# Patient Record
Sex: Male | Born: 1975 | Race: Black or African American | Hispanic: No | Marital: Married | Smoking: Never smoker
Health system: Southern US, Community
[De-identification: ages and names within clinical notes are randomized; demographics above are authoritative.]

## PROBLEM LIST (undated history)

## (undated) DIAGNOSIS — I10 Essential (primary) hypertension: Secondary | ICD-10-CM

## (undated) DIAGNOSIS — M109 Gout, unspecified: Secondary | ICD-10-CM

---

## 2021-06-01 ENCOUNTER — Encounter (HOSPITAL_COMMUNITY): Payer: Self-pay

## 2021-06-01 ENCOUNTER — Emergency Department (HOSPITAL_COMMUNITY): Payer: Medicaid Other

## 2021-06-01 ENCOUNTER — Emergency Department (HOSPITAL_COMMUNITY)
Admission: EM | Admit: 2021-06-01 | Discharge: 2021-06-01 | Disposition: A | Discharge status: Home / Self Care | Payer: Medicaid Other | Attending: Emergency Medicine | Admitting: Emergency Medicine

## 2021-06-01 ENCOUNTER — Other Ambulatory Visit: Payer: Self-pay

## 2021-06-01 DIAGNOSIS — W01198A Fall on same level from slipping, tripping and stumbling with subsequent striking against other object, initial encounter: Secondary | ICD-10-CM | POA: Diagnosis not present

## 2021-06-01 DIAGNOSIS — S01511A Laceration without foreign body of lip, initial encounter: Secondary | ICD-10-CM | POA: Insufficient documentation

## 2021-06-01 DIAGNOSIS — S069X9A Unspecified intracranial injury with loss of consciousness of unspecified duration, initial encounter: Secondary | ICD-10-CM

## 2021-06-01 DIAGNOSIS — Y990 Civilian activity done for income or pay: Secondary | ICD-10-CM | POA: Diagnosis not present

## 2021-06-01 DIAGNOSIS — S0990XA Unspecified injury of head, initial encounter: Secondary | ICD-10-CM | POA: Insufficient documentation

## 2021-06-01 DIAGNOSIS — I10 Essential (primary) hypertension: Secondary | ICD-10-CM | POA: Diagnosis not present

## 2021-06-01 DIAGNOSIS — S0993XA Unspecified injury of face, initial encounter: Secondary | ICD-10-CM | POA: Diagnosis present

## 2021-06-01 HISTORY — DX: Essential (primary) hypertension: I10

## 2021-06-01 HISTORY — DX: Gout, unspecified: M10.9

## 2021-06-01 MED ORDER — LIDOCAINE-EPINEPHRINE-TETRACAINE (LET) TOPICAL GEL
3.0000 mL | Freq: Once | TOPICAL | Status: DC
  Filled 2021-06-01: qty 3

## 2021-06-01 MED ORDER — ACETAMINOPHEN 500 MG PO TABS
1000.0000 mg | ORAL_TABLET | Freq: Once | ORAL | Status: AC
  Administered 2021-06-01: 1000 mg via ORAL
  Filled 2021-06-01: qty 2

## 2021-06-01 NOTE — ED Notes (Signed)
Patient transported to CT 

## 2021-06-01 NOTE — ED Provider Notes (Signed)
[facility] EMERGENCY DEPARTMENT Provider Note   CSN: [id number] Arrival date & time: 06/01/21  1519     History Chief Complaint  Patient presents with   Facial Injury    Derrick Harrell is a 45 y.o. male.  45 year old male who with history of hypertension and gout who presents with head injury and loss of consciousness.  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  He hit the back of his head and lost consciousness for a brief period.  He woke up on the ground coughing and choking on blood from a lip laceration.  He endorses pain in the back of his head as well as his lip but he denies any other areas of pain including no extremity injury, neck, or back pain.  No vision changes or vomiting since the injury.  No anticoagulant use.  He denies any history of syncopal episodes.  The history is provided by the patient.  Facial Injury     Past Medical History:  Diagnosis Date   Gout    Hypertension     There are no problems to display for this patient.     No family history on file.  Social History   Tobacco Use   Smoking status: Never   Smokeless tobacco: Never  Substance Use Topics   Alcohol use: Not Currently    Comment: 3 weeks sober   Drug use: Not Currently    Home Medications Prior to Admission medications   Not on File    Allergies    Patient has no known allergies.  Review of Systems   Review of Systems All other systems reviewed and are negative except that which was mentioned in HPI  Physical Exam Updated Vital Signs BP (!) 165/109   Pulse 94   Temp 98.9 F (37.2 C) (Oral)   Resp 17   Ht 5\' 8"  (1.727 m)   Wt 112.5 kg   SpO2 97%   BMI 37.71 kg/m   Physical Exam Constitutional:      General: He is not in acute distress.    Appearance: Normal appearance.   HENT:     Head: Normocephalic and atraumatic.     Comments: Tenderness posterior scalp without hematoma    Nose: Nose normal.     Mouth/Throat:     Mouth: Mucous membranes are moist.     Comments: L upper lip laceration w/ associated swelling, superficial and tracking under lip, does not cross vermilion border; no tooth injury Eyes:     Extraocular Movements: Extraocular movements intact.     Conjunctiva/sclera: Conjunctivae normal.     Pupils: Pupils are equal, round, and reactive to light.  Neck:     Comments: In c-collar, no focal tenderness Cardiovascular:     Rate and Rhythm: Normal rate and regular rhythm.     Heart sounds: Normal heart sounds. No murmur heard. Pulmonary:     Effort: Pulmonary effort is normal.     Breath sounds: Normal breath sounds.  Abdominal:     General: Abdomen is flat. Bowel sounds are normal. There is no distension.     Palpations: Abdomen is soft.     Tenderness: There is no abdominal tenderness.  Musculoskeletal:     Right lower leg: No edema.     Left lower leg: No  edema.  Skin:    General: Skin is warm and dry.  Neurological:     Mental Status: He is alert and oriented to person, place, and time.     Comments: Fluent speech, CN II-XII intact, 5/5 strength throughout and normal coordination  Psychiatric:        Mood and Affect: Mood normal.        Behavior: Behavior normal.    ED Results / Procedures / Treatments   Labs (all labs ordered are listed, but only abnormal results are displayed) Labs Reviewed - No data to display  EKG EKG Interpretation  Date/Time:  Thursday June 01 2021 15:27:11 EDT Ventricular Rate:  95 PR Interval:  180 QRS Duration: 103 QT Interval:  358 QTC Calculation: 450 R Axis:   -4 Text Interpretation: Sinus rhythm LAE, consider biatrial enlargement Consider anterior infarct No previous ECGs available Confirmed by Frederick Peers [phone]) on 06/01/2021 4:26:18 PM  Radiology CT Head Wo Contrast  Result Date:  06/01/2021 CLINICAL DATA:  Larey Seat off [place]let [place]. EXAM: CT HEAD WITHOUT CONTRAST CT CERVICAL SPINE WITHOUT CONTRAST TECHNIQUE: Multidetector CT imaging of the head and cervical spine was performed following the standard protocol without intravenous contrast. Multiplanar CT image reconstructions of the cervical spine were also generated. COMPARISON:  None. FINDINGS: CT HEAD FINDINGS Brain: No evidence of large-territorial acute infarction. No parenchymal hemorrhage. No mass lesion. No extra-axial collection. No mass effect or midline shift. No hydrocephalus. Basilar cisterns are patent. Vascular: No hyperdense vessel. Skull: No acute fracture or focal lesion. Sinuses/Orbits: Paranasal sinuses and mastoid air cells are clear. The orbits are unremarkable. Other: None. CT CERVICAL SPINE FINDINGS Alignment: Reversal of the normal cervical lordosis likely due to positioning and degenerative changes. Skull base and vertebrae: Linear density along the right C1 transverse process nonspecific with no definite fracture. No acute fracture. Multilevel mild degenerative changes of the spine. No aggressive appearing focal osseous lesion or focal pathologic process. Soft tissues and spinal canal: No prevertebral fluid or swelling. No visible canal hematoma. Upper chest: Right apex trace pleural/pulmonary scarring. No definite pneumothorax. Other: None. IMPRESSION: 1. No acute intracranial abnormality. 2. No acute displaced fracture or traumatic listhesis of the cervical spine. Electronically Signed   By: Tish Frederickson M.D.   On: 06/01/2021 16:53   CT Cervical Spine Wo Contrast  Result Date: 06/01/2021 CLINICAL DATA:  Larey Seat off [place]. EXAM: CT HEAD WITHOUT CONTRAST CT CERVICAL SPINE WITHOUT CONTRAST TECHNIQUE: Multidetector CT imaging of the head and cervical spine was performed following the standard protocol without intravenous contrast. Multiplanar CT image reconstructions of the cervical spine were also generated.  COMPARISON:  None. FINDINGS: CT HEAD FINDINGS Brain: No evidence of large-territorial acute infarction. No parenchymal hemorrhage. No mass lesion. No extra-axial collection. No mass effect or midline shift. No hydrocephalus. Basilar cisterns are patent. Vascular: No hyperdense vessel. Skull: No acute fracture or focal lesion. Sinuses/Orbits: Paranasal sinuses and mastoid air cells are clear. The orbits are unremarkable. Other: None. CT CERVICAL SPINE FINDINGS Alignment: Reversal of the normal cervical lordosis likely due to positioning and degenerative changes. Skull base and vertebrae: Linear density along the right C1 transverse process nonspecific with no definite fracture. No acute fracture. Multilevel mild degenerative changes of the spine. No aggressive appearing focal osseous lesion or focal pathologic process. Soft tissues and spinal canal: No prevertebral fluid or swelling. No visible canal hematoma. Upper chest: Right apex trace pleural/pulmonary scarring. No definite pneumothorax. Other: None. IMPRESSION: 1. No acute intracranial abnormality. 2.  No acute displaced fracture or traumatic listhesis of the cervical spine. Electronically Signed   By: Tish Frederickson M.D.   On: 06/01/2021 16:53    Procedures Procedures   Medications Ordered in ED Medications  acetaminophen (TYLENOL) tablet 1,000 mg (1,000 mg Oral Given 06/01/21 1647)    ED Course  I have reviewed the triage vital signs and the nursing notes.  Pertinent labs & imaging results that were available during my care of the patient were reviewed by me and considered in my medical decision making (see chart for details).    MDM Rules/Calculators/A&P                          Alert and oriented on exam, neurologically intact.  Lip laceration is superficial and does not involve vermilion border therefore does not need sutures, recommended healing secondarily.  Discussed supportive measures for mouth injury.  EKG shows sinus rhythm.  CT  head and cervical spine show no acute injury.  Suspect loss of consciousness was due to blunt head trauma with possible concussion rather than due to syncope.  I have counseled on what to expect regarding concussion symptoms.  I have extensively reviewed return precautions with the patient and he voiced understanding. Final Clinical Impression(s) / ED Diagnoses Final diagnoses:  Head injury with loss of consciousness (HCC)  Lip laceration, initial encounter    Rx / DC Orders ED Discharge Orders     None        Maleeah Crossman, Ambrose Finland, MD 06/01/21 1819

## 2021-06-01 NOTE — ED Triage Notes (Signed)
Pt BIB [facility] for a work related injury. 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 Pt remembers feeling his hit the concrete and woke up to choking on blood. Pt reports a brief LOC. Denies N/V, endorses a headache.  BP 156/100 HR 100 RR 18 CBG 114

## 2021-08-04 ENCOUNTER — Ambulatory Visit: Payer: Medicaid Other | Admitting: Cardiovascular Disease

## 2021-08-25 ENCOUNTER — Other Ambulatory Visit: Payer: Self-pay

## 2021-08-25 ENCOUNTER — Other Ambulatory Visit: Payer: Self-pay | Admitting: Urgent Care

## 2021-08-25 ENCOUNTER — Ambulatory Visit
Admission: RE | Admit: 2021-08-25 | Discharge: 2021-08-25 | Disposition: A | Discharge status: Home / Self Care | Payer: Medicaid Other | Source: Ambulatory Visit | Attending: Urgent Care | Admitting: Urgent Care

## 2021-08-25 DIAGNOSIS — M546 Pain in thoracic spine: Secondary | ICD-10-CM

## 2021-08-25 DIAGNOSIS — M545 Low back pain, unspecified: Secondary | ICD-10-CM

## 2021-09-06 ENCOUNTER — Telehealth: Payer: Self-pay

## 2021-09-06 NOTE — Telephone Encounter (Signed)
NOTES SCANNED TO REFERRAL RJ 

## 2022-01-11 IMAGING — CT CT HEAD W/O CM
3 series · 14 of 47 positions shown, 16 images · non-contrast
Comparison: None.

CLINICAL DATA: Fell off Pallet [place].

EXAM:
CT HEAD WITHOUT CONTRAST
CT CERVICAL SPINE WITHOUT CONTRAST
TECHNIQUE: Multidetector CT imaging of the head and cervical spine was
performed following the standard protocol without intravenous
contrast. Multiplanar CT image reconstructions of the cervical spine
were also generated.

[Series 1: head 5.0 h30s · axial · 0.46mm/px · z∈[-65,+65]mm · 8 of 32 slices shown, 10 images]
[im 3/32  brain]
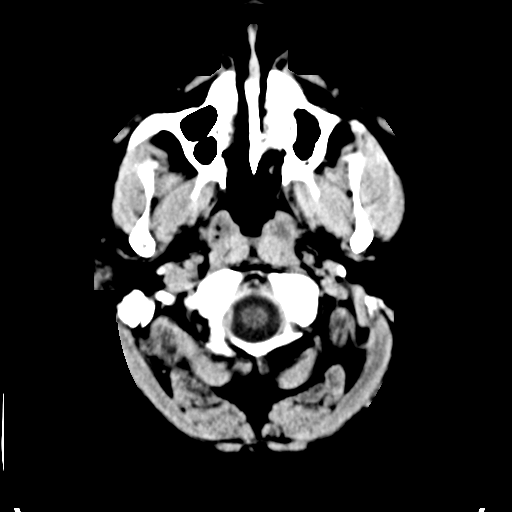
[im 3/32  bone]
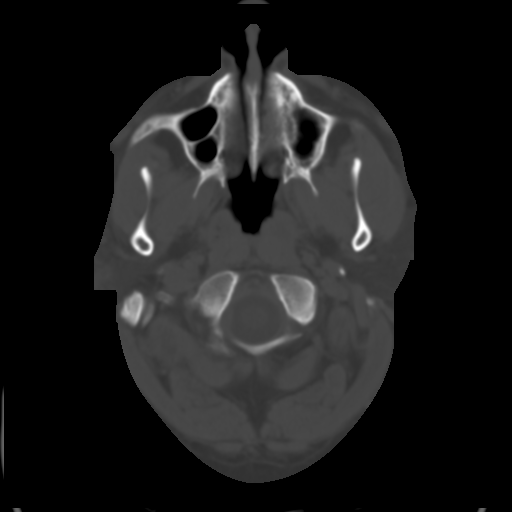
[im 7/32  brain]
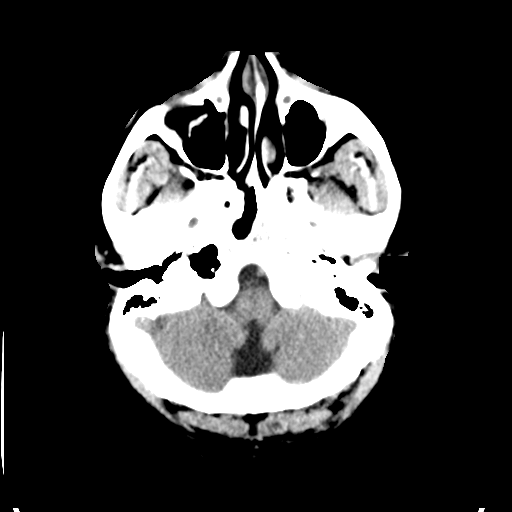
[im 10/32  brain]
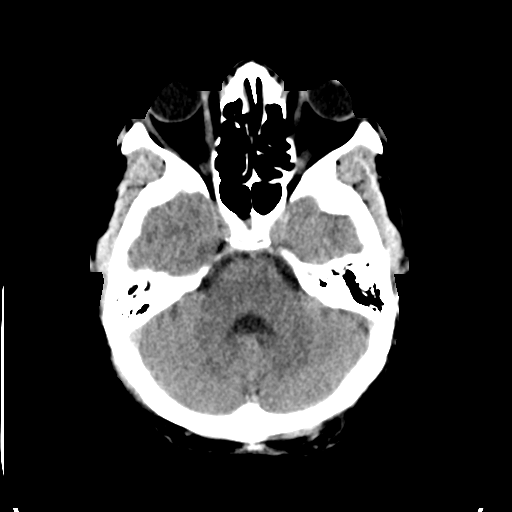
[im 14/32  brain]
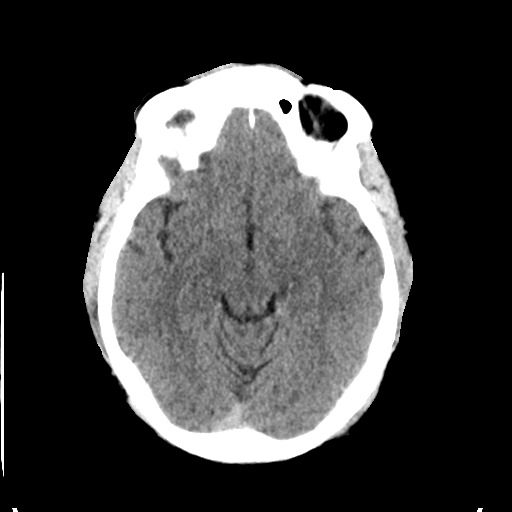
[im 18/32  brain]
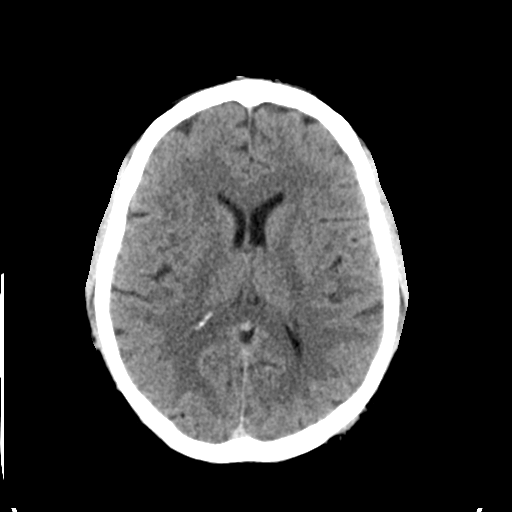
[im 18/32  bone]
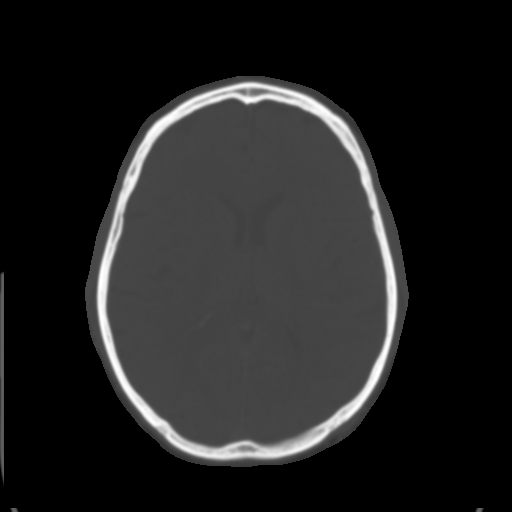
[im 22/32  brain]
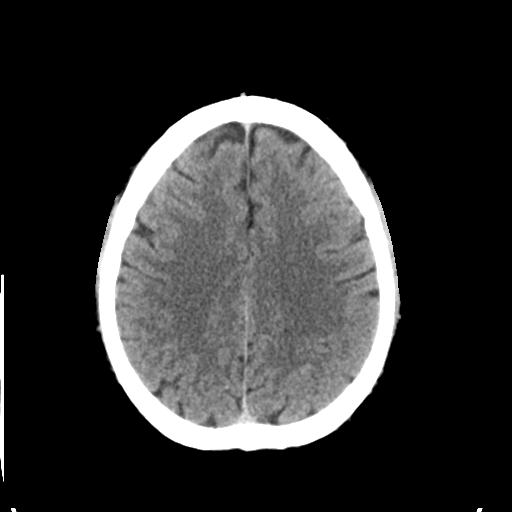
[im 25/32  brain]
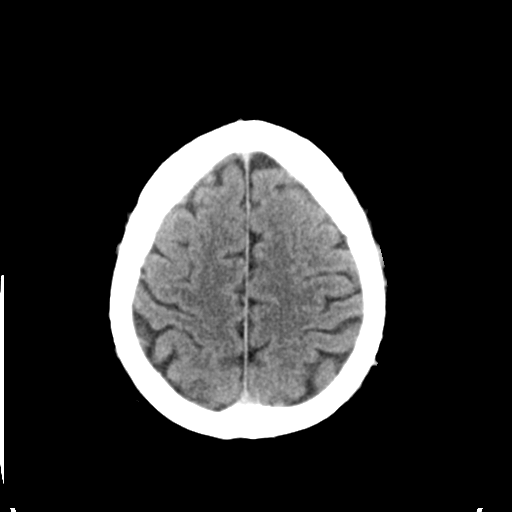
[im 29/32  brain]
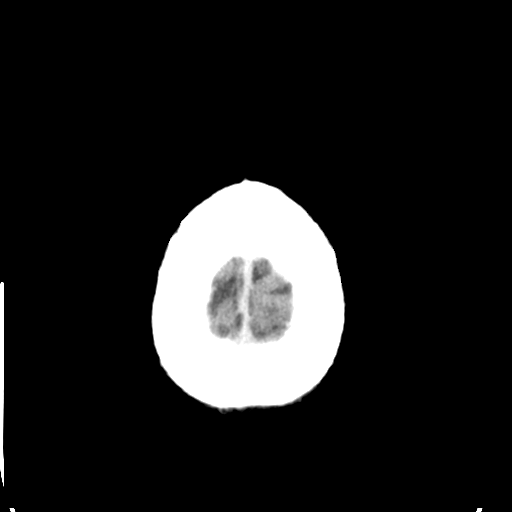

[Series 5: head 3.0 mpr cor · coronal · 0.33mm/px · 3 of 70 slices shown]
[im 24/70  brain]
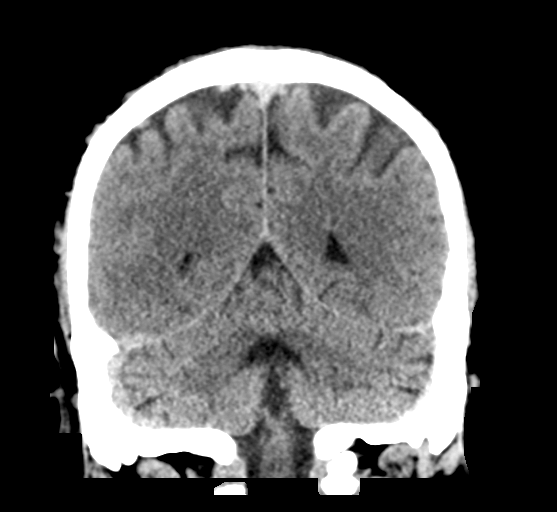
[im 31/70  brain]
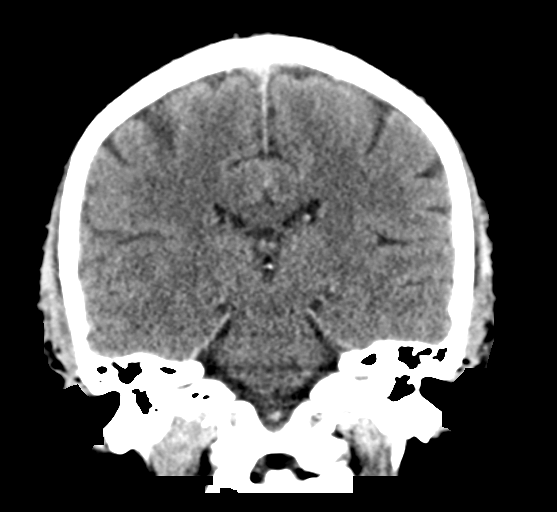
[im 39/70  brain]
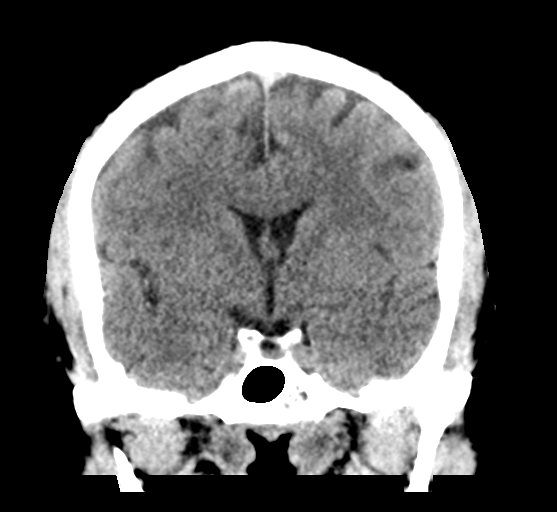

[Series 6: head 3.0 mpr sag · sagittal · 0.31mm/px · 3 of 67 slices shown]
[im 23/67  brain]
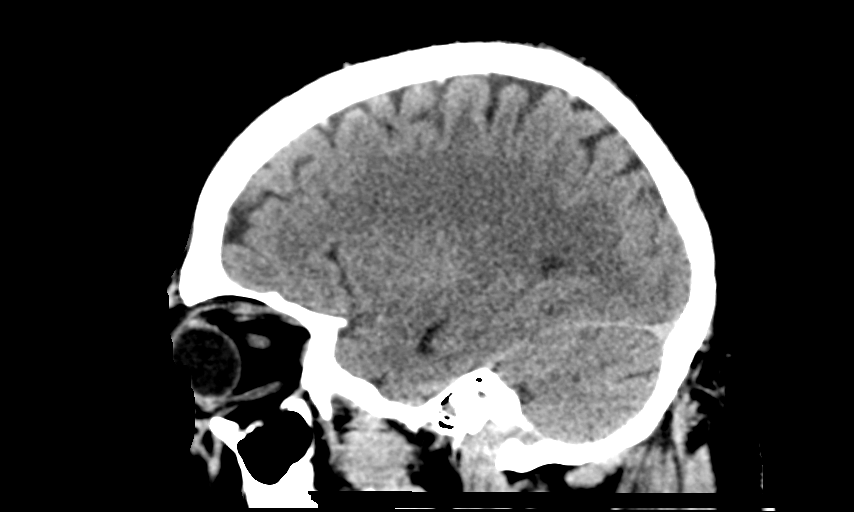
[im 34/67  brain]
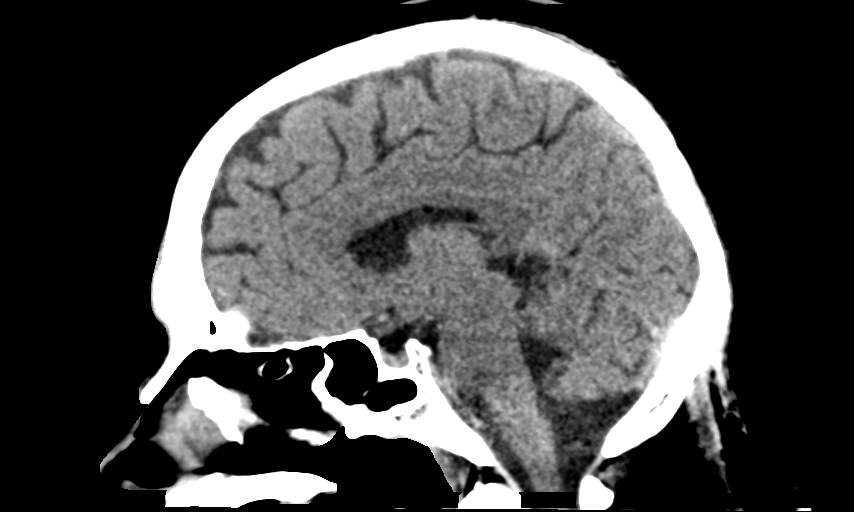
[im 45/67  brain]
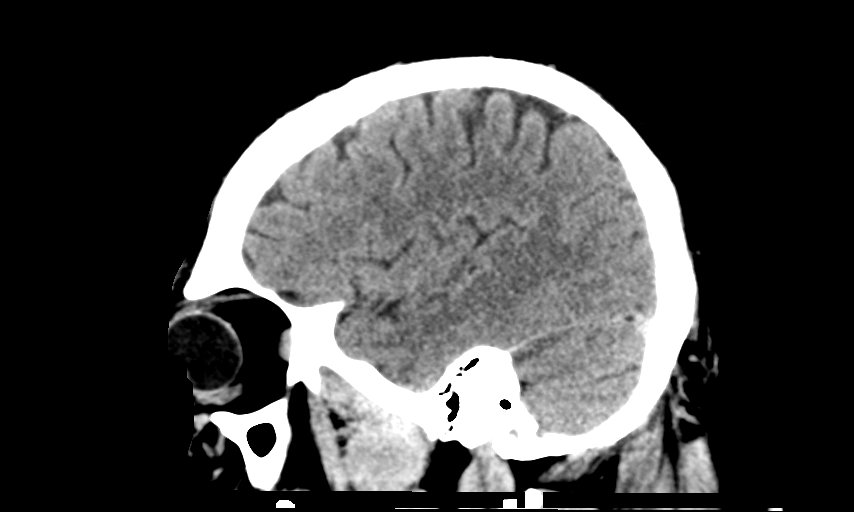

[14 of 47 positions shown; findings below may reference images not displayed]

FINDINGS: CT HEAD FINDINGS

Brain:

No evidence of large-territorial acute infarction. No parenchymal
hemorrhage. No mass lesion. No extra-axial collection.

No mass effect or midline shift. No hydrocephalus. Basilar cisterns
are patent.

Vascular: No hyperdense vessel.

Skull: No acute fracture or focal lesion.

Sinuses/Orbits: Paranasal sinuses and mastoid air cells are clear.
The orbits are unremarkable.

Other: None.

CT CERVICAL SPINE FINDINGS

Alignment: Reversal of the normal cervical lordosis likely due to
positioning and degenerative changes.

Skull base and vertebrae: Linear density along the right C1
transverse process nonspecific with no definite fracture. No acute
fracture. Multilevel mild degenerative changes of the spine. No
aggressive appearing focal osseous lesion or focal pathologic
process.

Soft tissues and spinal canal: No prevertebral fluid or swelling. No
visible canal hematoma.

Upper chest: Right apex trace pleural/pulmonary scarring. No
definite pneumothorax.

Other: None.
IMPRESSION: 1. No acute intracranial abnormality.
2. No acute displaced fracture or traumatic listhesis of the
cervical spine.

## 2023-08-21 ENCOUNTER — Emergency Department (HOSPITAL_COMMUNITY)
Admission: EM | Admit: 2023-08-21 | Discharge: 2023-08-21 | Disposition: A | Discharge status: Home / Self Care | Payer: Medicaid Other | Attending: Emergency Medicine | Admitting: Emergency Medicine

## 2023-08-21 ENCOUNTER — Encounter (HOSPITAL_COMMUNITY): Payer: Self-pay | Admitting: Emergency Medicine

## 2023-08-21 ENCOUNTER — Emergency Department (HOSPITAL_COMMUNITY): Payer: Medicaid Other

## 2023-08-21 DIAGNOSIS — K7689 Other specified diseases of liver: Secondary | ICD-10-CM | POA: Diagnosis not present

## 2023-08-21 DIAGNOSIS — R1032 Left lower quadrant pain: Secondary | ICD-10-CM

## 2023-08-21 DIAGNOSIS — K5900 Constipation, unspecified: Secondary | ICD-10-CM | POA: Diagnosis not present

## 2023-08-21 LAB — URINALYSIS, ROUTINE W REFLEX MICROSCOPIC
Bacteria, UA: NONE SEEN
Bilirubin Urine: NEGATIVE
Glucose, UA: NEGATIVE mg/dL
Ketones, ur: 5 mg/dL — AB
Leukocytes,Ua: NEGATIVE
Nitrite: NEGATIVE
Protein, ur: NEGATIVE mg/dL
Specific Gravity, Urine: 1.038 — ABNORMAL HIGH (ref 1.005–1.030)
pH: 5 (ref 5.0–8.0)

## 2023-08-21 LAB — CBC WITH DIFFERENTIAL/PLATELET
Abs Immature Granulocytes: 0.02 10*3/uL (ref 0.00–0.07)
Basophils Absolute: 0 10*3/uL (ref 0.0–0.1)
Basophils Relative: 0 %
Eosinophils Absolute: 0 10*3/uL (ref 0.0–0.5)
Eosinophils Relative: 0 %
HCT: 50.5 % (ref 39.0–52.0)
Hemoglobin: 16 g/dL (ref 13.0–17.0)
Immature Granulocytes: 0 %
Lymphocytes Relative: 35 %
Lymphs Abs: 2.5 10*3/uL (ref 0.7–4.0)
MCH: 26.2 pg (ref 26.0–34.0)
MCHC: 31.7 g/dL (ref 30.0–36.0)
MCV: 82.7 fL (ref 80.0–100.0)
Monocytes Absolute: 0.4 10*3/uL (ref 0.1–1.0)
Monocytes Relative: 6 %
Neutro Abs: 4.1 10*3/uL (ref 1.7–7.7)
Neutrophils Relative %: 59 %
Platelets: 245 10*3/uL (ref 150–400)
RBC: 6.11 MIL/uL — ABNORMAL HIGH (ref 4.22–5.81)
RDW: 14.5 % (ref 11.5–15.5)
WBC: 7.1 10*3/uL (ref 4.0–10.5)
nRBC: 0 % (ref 0.0–0.2)

## 2023-08-21 LAB — COMPREHENSIVE METABOLIC PANEL
ALT: 23 U/L (ref 0–44)
AST: 20 U/L (ref 15–41)
Albumin: 3.8 g/dL (ref 3.5–5.0)
Alkaline Phosphatase: 63 U/L (ref 38–126)
Anion gap: 11 (ref 5–15)
BUN: 5 mg/dL — ABNORMAL LOW (ref 6–20)
CO2: 21 mmol/L — ABNORMAL LOW (ref 22–32)
Calcium: 8.9 mg/dL (ref 8.9–10.3)
Chloride: 103 mmol/L (ref 98–111)
Creatinine, Ser: 0.93 mg/dL (ref 0.61–1.24)
GFR, Estimated: >60 mL/min (ref >60)
Glucose, Bld: 102 mg/dL — ABNORMAL HIGH (ref 70–99)
Potassium: 4 mmol/L (ref 3.5–5.1)
Sodium: 135 mmol/L (ref 135–145)
Total Bilirubin: 0.6 mg/dL (ref 0.3–1.2)
Total Protein: 7.3 g/dL (ref 6.5–8.1)

## 2023-08-21 LAB — LIPASE, BLOOD: Lipase: 37 U/L (ref 11–51)

## 2023-08-21 MED ORDER — POLYETHYLENE GLYCOL 3350 17 GM/SCOOP PO POWD: 17.0000 g | Freq: Every day | ORAL | 0 refills | Status: AC

## 2023-08-21 MED ORDER — IOHEXOL 350 MG/ML SOLN
75.0000 mL | Freq: Once | INTRAVENOUS | Status: AC | PRN
  Administered 2023-08-21: 75 mL via INTRAVENOUS

## 2023-08-21 MED ORDER — DOCUSATE SODIUM 100 MG PO CAPS: 100.0000 mg | ORAL_CAPSULE | Freq: Two times a day (BID) | ORAL | 0 refills | Status: DC

## 2023-08-21 NOTE — ED Provider Notes (Signed)
[place] EMERGENCY DEPARTMENT AT [facility] Provider Note   CSN: [id number] Arrival date & time: 08/21/23  1659     History  Chief Complaint  Patient presents with   Abdominal Pain    Derrick Harrell is a 47 y.o. male.  Patient presents to the emergency department for evaluation of left lower quadrant and left-sided flank pain ongoing over the past 2 weeks.  Patient has a history of precancerous polyps on colonoscopy.  He states that eating and drinking sometimes it makes the symptoms worse and it feels as though the food gets stuck, but he denies choking or regurgitation.  He has been having small amounts of hard stool and has been taking laxatives without much improvement.  No urinary symptoms.  No fevers.  No chest pain or shortness of breath.  He does drink alcohol on a daily basis.       Home Medications Prior to Admission medications   Medication Sig Start Date End Date Taking? Authorizing Provider  amLODipine (NORVASC) 10 MG tablet Take 10 mg by mouth every morning. 03/09/21   [provider]  atorvastatin (LIPITOR) 20 MG tablet Take 20 mg by mouth at bedtime. 05/11/21   [provider]  buPROPion (WELLBUTRIN SR) 150 MG 12 hr tablet Take 150 mg by mouth 2 (two) times daily. 05/26/21   [provider]  carisoprodol (SOMA) 350 MG tablet Take 350 mg by mouth 3 (three) times daily as needed.    [provider]  chlorthalidone (HYGROTON) 25 MG tablet Take 25 mg by mouth daily. 05/26/21   [provider]  fluticasone (FLONASE) 50 MCG/ACT nasal spray Place 1 spray into both nostrils 2 (two) times daily. 05/11/21   [provider]      Allergies    Patient has no known allergies.    Review of Systems   Review of Systems  Physical Exam Updated Vital Signs BP (!) 168/101 (BP Location: Right Arm)   Pulse 85   Temp 98.4 F (36.9 C) (Oral)   Resp 18   SpO2 98%  Physical Exam Vitals and nursing note reviewed.   Constitutional:      General: He is not in acute distress.    Appearance: He is well-developed.  HENT:     Head: Normocephalic and atraumatic.  Eyes:     General:        Right eye: No discharge.        Left eye: No discharge.     Conjunctiva/sclera: Conjunctivae normal.  Cardiovascular:     Rate and Rhythm: Normal rate and regular rhythm.     Heart sounds: Normal heart sounds.  Pulmonary:     Effort: Pulmonary effort is normal.     Breath sounds: Normal breath sounds.  Abdominal:     Palpations: Abdomen is soft.     Tenderness: There is abdominal tenderness in the left lower quadrant. There is guarding. There is no rebound. Negative signs include Murphy's sign and McBurney's sign.     Hernia: No hernia is present.  Musculoskeletal:     Cervical back: Normal range of motion and neck supple.  Skin:    General: Skin is warm and dry.  Neurological:     Mental Status: He is alert.     ED Results / Procedures / Treatments   Labs (all labs ordered are listed, but only abnormal results are displayed) Labs Reviewed  COMPREHENSIVE METABOLIC PANEL - Abnormal; Notable for the following components:  Result Value   CO2 21 (*)    Glucose, Bld 102 (*)    BUN 5 (*)    All other components within normal limits  CBC WITH DIFFERENTIAL/PLATELET - Abnormal; Notable for the following components:   RBC 6.11 (*)    All other components within normal limits  URINALYSIS, ROUTINE W REFLEX MICROSCOPIC - Abnormal; Notable for the following components:   Specific Gravity, Urine 1.038 (*)    Hgb urine dipstick SMALL (*)    Ketones, ur 5 (*)    All other components within normal limits  LIPASE, BLOOD    EKG None  Radiology CT ABDOMEN PELVIS W CONTRAST  Result Date: 08/21/2023 CLINICAL DATA:  LLQ abdominal pain pain x 2 weeks, LLQ and flank EXAM: CT ABDOMEN AND PELVIS WITH CONTRAST TECHNIQUE: Multidetector CT imaging of the abdomen and pelvis was performed using the standard protocol  following bolus administration of intravenous contrast. RADIATION DOSE REDUCTION: This exam was performed according to the departmental dose-optimization program which includes automated exposure control, adjustment of the mA and/or kV according to patient size and/or use of iterative reconstruction technique. CONTRAST:  75mL OMNIPAQUE IOHEXOL 350 MG/ML SOLN COMPARISON:  None Available. FINDINGS: Lower chest: There are patchy atelectatic changes in the visualized lung bases. No overt consolidation. No pleural effusion. The heart is normal in size. No pericardial effusion. Hepatobiliary: The liver is normal in size. Non-cirrhotic configuration. There are at least 5, ill-defined, hypoattenuating lesions with largest measuring up to 1.0 x 1.3 cm. These can not be characterized as simple cyst on the basis of this exam. These are indeterminate in etiology. Further evaluation with nonemergent MRI abdomen as per liver mass protocol is recommended. There is a 13 x 13 mm hypoattenuating lesion with well-circumscribed margins in the right hepatic lobe, segment 5, which can be characterized as a simple cyst. No intrahepatic or extrahepatic bile duct dilation. No calcified gallstones. Normal gallbladder wall thickness. No pericholecystic inflammatory changes. Pancreas: Unremarkable. No pancreatic ductal dilatation or surrounding inflammatory changes. Spleen: Within normal limits. No focal lesion. Adrenals/Urinary Tract: There is a 10 x 15 mm right adrenal nodule, incompletely characterized on the current exam. There is diffuse thickening of left adrenal gland, without discrete nodule. Findings are nonspecific but mostly associated with adrenal hyperplasia. No suspicious renal mass. No hydronephrosis. No renal or ureteric calculi. Unremarkable urinary bladder. Stomach/Bowel: There is a small sliding hiatal hernia. No disproportionate dilation of the small or large bowel loops. No evidence of abnormal bowel wall thickening or  inflammatory changes. The appendix is unremarkable. Vascular/Lymphatic: No ascites or pneumoperitoneum. No abdominal or pelvic lymphadenopathy, by size criteria. No aneurysmal dilation of the major abdominal arteries. There are mild peripheral atherosclerotic vascular calcifications of the aorta and its major branches. Reproductive: Normal size prostate. Symmetric seminal vesicles. Other: There is a tiny fat containing umbilical hernia. The soft tissues and abdominal wall are otherwise unremarkable. Musculoskeletal: No suspicious osseous lesions. IMPRESSION: 1. No acute inflammatory process identified in the abdomen or pelvis. 2. Multiple hypoattenuating lesions in the liver which are indeterminate on the basis of this exam. Further evaluation with nonemergent MRI abdomen as per liver mass protocol is recommended. 3. Right adrenal nodule, incompletely characterized on the current exam. This can be also further evaluated on MRI abdomen. 4. Multiple other nonacute observations, as described above. Electronically Signed   By: Jules Schick M.D.   On: 08/21/2023 21:29    Procedures Procedures    Medications Ordered in ED Medications - No  data to display  ED Course/ Medical Decision Making/ A&P    Patient seen and examined. History obtained directly from patient. Work-up including labs, imaging, EKG ordered in triage, if performed, were reviewed.    Labs/EKG: Independently reviewed and interpreted.  This included: CBC unremarkable; CMP normal kidney function and electrolytes; lipase normal.  UA pending.  Imaging: CT of the abdomen pelvis ordered to evaluate for signs of obstruction or diverticulitis.  Medications/Fluids: None ordered  Most recent vital signs reviewed and are as follows: BP (!) 168/101 (BP Location: Right Arm)   Pulse 85   Temp 98.4 F (36.9 C) (Oral)   Resp 18   SpO2 98%   Initial impression: Left lower quadrant abdominal pain x 2 weeks with constipation.  11:05 PM  Reassessment performed. Patient appears stable, exam unchanged.  He looks comfortable.  Labs personally reviewed and interpreted including: UA without signs of infection.  Imaging personally visualized and interpreted including: CT abdomen pelvis, agree negative for acute findings, indeterminate liver cysts and adrenal cyst noted.  Reviewed pertinent lab work and imaging with patient at bedside. Questions answered.  We had a discussion about the incidental findings on the CT and need for follow-up with MRI.  Patient does not currently have a PCP but thinks he may be able to follow-up with his previous doctor.  Wife on phone at the time of discussion as well.  He is aware of need for follow-up.  Most current vital signs reviewed and are as follows: BP (!) 146/101   Pulse 84   Temp 98.5 F (36.9 C) (Oral)   Resp 19   SpO2 100%   Plan: Discharge to home.   Prescriptions written for: MiraLAX, Colace  Other home care instructions discussed: Maintain good hydration  ED return instructions discussed: The patient was urged to return to the Emergency Department immediately with worsening of current symptoms, worsening abdominal pain, persistent vomiting, blood noted in stools, fever, or any other concerns. The patient verbalized understanding.   Follow-up instructions discussed: Patient encouraged to follow-up with their PCP in 7 days.                                   Medical Decision Making Amount and/or Complexity of Data Reviewed Labs: ordered. Radiology: ordered.  Risk OTC drugs. Prescription drug management.   For this patient's complaint of abdominal pain, the following conditions were considered on the differential diagnosis: gastritis/PUD, enteritis/duodenitis, appendicitis, cholelithiasis/cholecystitis, cholangitis, pancreatitis, ruptured viscus, colitis, diverticulitis, small/large bowel obstruction, proctitis, cystitis, pyelonephritis, ureteral colic, aortic dissection,  aortic aneurysm. Atypical chest etiologies were also considered including ACS, PE, and pneumonia.  The patient's vital signs, pertinent lab work and imaging were reviewed and interpreted as discussed in the ED course. Hospitalization was considered for further testing, treatments, or serial exams/observation. However as patient is well-appearing, has a stable exam, and reassuring studies today, I do not feel that they warrant admission at this time. This plan was discussed with the patient who verbalizes agreement and comfort with this plan and seems reliable and able to return to the Emergency Department with worsening or changing symptoms.           Final Clinical Impression(s) / ED Diagnoses Final diagnoses:  LLQ abdominal pain  Constipation, unspecified constipation type  Liver cyst    Rx / DC Orders ED Discharge Orders          Ordered  polyethylene glycol powder (GLYCOLAX/MIRALAX) 17 GM/SCOOP powder  Daily        08/21/23 2302    docusate sodium (COLACE) 100 MG capsule  Every 12 hours        08/21/23 2302              Renne Crigler, PA-C 08/21/23 2307    Benjiman Core, MD 08/22/23 2320

## 2023-08-21 NOTE — ED Notes (Signed)
Patient transported to CT 

## 2023-08-21 NOTE — Discharge Instructions (Addendum)
Please read and follow all provided instructions.  Your diagnoses today include:  1. LLQ abdominal pain   2. Constipation, unspecified constipation type   3. Liver cyst     Tests performed today include: Blood cell counts and platelets Kidney and liver function tests Pancreas function test (called lipase) Urine test to look for infection CT scan of your abdomen pelvis did not show any problems in the bowel, however does have some liver cysts which will need further evaluation.  This will be done as an outpatient with an MRI.  Please follow-up with your primary care doctor regarding this. Vital signs. See below for your results today.   Medications prescribed:  Miralax - laxative  This medication can be found over-the-counter.   Colace - stool softener  This medication can be found over-the-counter.   Continue colace for 2 weeks after your stools return to normal to prevent constipation.   Take any prescribed medications only as directed.  Home care instructions:  Follow any educational materials contained in this packet.  Follow-up instructions: Please follow-up with your primary care provider in the next 7 days for further evaluation of your symptoms.    Return instructions:  SEEK IMMEDIATE MEDICAL ATTENTION IF: The pain does not go away or becomes severe  A temperature above 101F develops  Repeated vomiting occurs (multiple episodes)  The pain becomes localized to portions of the abdomen. The right side could possibly be appendicitis. In an adult, the left lower portion of the abdomen could be colitis or diverticulitis.  Blood is being passed in stools or vomit (bright red or black tarry stools)  You develop chest pain, difficulty breathing, dizziness or fainting, or become confused, poorly responsive, or inconsolable (young children) If you have any other emergent concerns regarding your health  Additional Information: Abdominal (belly) pain can be caused by many  things. Your caregiver performed an examination and possibly ordered blood/urine tests and imaging (CT scan, x-rays, ultrasound). Many cases can be observed and treated at home after initial evaluation in the emergency department. Even though you are being discharged home, abdominal pain can be unpredictable. Therefore, you need a repeated exam if your pain does not resolve, returns, or worsens. Most patients with abdominal pain don't have to be admitted to the hospital or have surgery, but serious problems like appendicitis and gallbladder attacks can start out as nonspecific pain. Many abdominal conditions cannot be diagnosed in one visit, so follow-up evaluations are very important.  Your vital signs today were: BP (!) 146/101   Pulse 84   Temp 98.5 F (36.9 C) (Oral)   Resp 19   SpO2 100%  If your blood pressure (bp) was elevated above 135/85 this visit, please have this repeated by your doctor within one month. --------------

## 2023-08-21 NOTE — ED Triage Notes (Signed)
Pt here from home with c/o abd ( llq) times 2 weeks , has not a good BM in 2 weeks also  despite taking otc laxities pt states that he does drink alcohol daily

## 2023-09-05 ENCOUNTER — Ambulatory Visit: Payer: Self-pay | Admitting: Internal Medicine

## 2023-09-24 ENCOUNTER — Other Ambulatory Visit: Payer: Self-pay | Admitting: Physician Assistant

## 2023-09-24 DIAGNOSIS — K7689 Other specified diseases of liver: Secondary | ICD-10-CM

## 2023-10-07 ENCOUNTER — Encounter: Payer: Self-pay | Admitting: Physician Assistant

## 2023-10-09 ENCOUNTER — Encounter: Payer: Self-pay | Admitting: Physician Assistant

## 2023-10-10 ENCOUNTER — Ambulatory Visit
Admission: RE | Admit: 2023-10-10 | Discharge: 2023-10-10 | Disposition: A | Discharge status: Home / Self Care | Payer: Medicaid Other | Source: Ambulatory Visit | Attending: Physician Assistant | Admitting: Physician Assistant

## 2023-10-10 DIAGNOSIS — K7689 Other specified diseases of liver: Secondary | ICD-10-CM

## 2023-10-10 MED ORDER — GADOPICLENOL 0.5 MMOL/ML IV SOLN
10.0000 mL | Freq: Once | INTRAVENOUS | Status: AC | PRN
  Administered 2023-10-10: 10 mL via INTRAVENOUS

## 2024-02-12 ENCOUNTER — Encounter: Payer: Self-pay | Admitting: Family Medicine

## 2024-02-12 ENCOUNTER — Ambulatory Visit (INDEPENDENT_AMBULATORY_CARE_PROVIDER_SITE_OTHER): Payer: Medicaid Other | Admitting: Family Medicine

## 2024-02-12 VITALS — BP 148/88 | HR 80 | Temp 99.1°F | Ht 68.0 in | Wt 260.0 lb

## 2024-02-12 DIAGNOSIS — D1803 Hemangioma of intra-abdominal structures: Secondary | ICD-10-CM

## 2024-02-12 DIAGNOSIS — I10 Essential (primary) hypertension: Secondary | ICD-10-CM | POA: Diagnosis not present

## 2024-02-12 DIAGNOSIS — I700 Atherosclerosis of aorta: Secondary | ICD-10-CM | POA: Diagnosis not present

## 2024-02-12 DIAGNOSIS — K429 Umbilical hernia without obstruction or gangrene: Secondary | ICD-10-CM

## 2024-02-12 DIAGNOSIS — K5900 Constipation, unspecified: Secondary | ICD-10-CM | POA: Insufficient documentation

## 2024-02-12 DIAGNOSIS — F1090 Alcohol use, unspecified, uncomplicated: Secondary | ICD-10-CM

## 2024-02-12 DIAGNOSIS — E279 Disorder of adrenal gland, unspecified: Secondary | ICD-10-CM | POA: Insufficient documentation

## 2024-02-12 DIAGNOSIS — Z79899 Other long term (current) drug therapy: Secondary | ICD-10-CM

## 2024-02-12 DIAGNOSIS — K921 Melena: Secondary | ICD-10-CM | POA: Insufficient documentation

## 2024-02-12 DIAGNOSIS — E782 Mixed hyperlipidemia: Secondary | ICD-10-CM

## 2024-02-12 DIAGNOSIS — Z720 Tobacco use: Secondary | ICD-10-CM

## 2024-02-12 DIAGNOSIS — K449 Diaphragmatic hernia without obstruction or gangrene: Secondary | ICD-10-CM | POA: Insufficient documentation

## 2024-02-12 LAB — CBC WITH DIFFERENTIAL/PLATELET
Basophils Absolute: 0 10*3/uL (ref 0.0–0.1)
Basophils Relative: 0.6 % (ref 0.0–3.0)
Eosinophils Absolute: 0.1 10*3/uL (ref 0.0–0.7)
Eosinophils Relative: 0.8 % (ref 0.0–5.0)
HCT: 47.8 % (ref 39.0–52.0)
Hemoglobin: 15.4 g/dL (ref 13.0–17.0)
Lymphocytes Relative: 40.2 % (ref 12.0–46.0)
Lymphs Abs: 2.7 10*3/uL (ref 0.7–4.0)
MCHC: 32.3 g/dL (ref 30.0–36.0)
MCV: 82.7 fl (ref 78.0–100.0)
Monocytes Absolute: 0.5 10*3/uL (ref 0.1–1.0)
Monocytes Relative: 7.2 % (ref 3.0–12.0)
Neutro Abs: 3.4 10*3/uL (ref 1.4–7.7)
Neutrophils Relative %: 51.2 % (ref 43.0–77.0)
Platelets: 284 10*3/uL (ref 150.0–400.0)
RBC: 5.77 Mil/uL (ref 4.22–5.81)
RDW: 15.8 % — ABNORMAL HIGH (ref 11.5–15.5)
WBC: 6.7 10*3/uL (ref 4.0–10.5)

## 2024-02-12 LAB — LIPID PANEL
Cholesterol: 242 mg/dL — ABNORMAL HIGH (ref 0–200)
HDL: 54.1 mg/dL (ref >39.00)
LDL Cholesterol: 168 mg/dL — ABNORMAL HIGH (ref 0–99)
NonHDL: 188.34
Total CHOL/HDL Ratio: 4
Triglycerides: 104 mg/dL (ref 0.0–149.0)
VLDL: 20.8 mg/dL (ref 0.0–40.0)

## 2024-02-12 LAB — MAGNESIUM: Magnesium: 2.3 mg/dL (ref 1.5–2.5)

## 2024-02-12 LAB — COMPREHENSIVE METABOLIC PANEL
ALT: 20 U/L (ref 0–53)
AST: 16 U/L (ref 0–37)
Albumin: 4.4 g/dL (ref 3.5–5.2)
Alkaline Phosphatase: 59 U/L (ref 39–117)
BUN: 11 mg/dL (ref 6–23)
CO2: 26 meq/L (ref 19–32)
Calcium: 9.7 mg/dL (ref 8.4–10.5)
Chloride: 105 meq/L (ref 96–112)
Creatinine, Ser: 0.81 mg/dL (ref 0.40–1.50)
GFR: 104.96 mL/min (ref >60.00)
Glucose, Bld: 96 mg/dL (ref 70–99)
Potassium: 4.1 meq/L (ref 3.5–5.1)
Sodium: 138 meq/L (ref 135–145)
Total Bilirubin: 0.4 mg/dL (ref 0.2–1.2)
Total Protein: 7.5 g/dL (ref 6.0–8.3)

## 2024-02-12 LAB — VITAMIN B12: Vitamin B-12: 196 pg/mL — ABNORMAL LOW (ref 211–911)

## 2024-02-12 LAB — VITAMIN D 25 HYDROXY (VIT D DEFICIENCY, FRACTURES): VITD: 13.56 ng/mL — ABNORMAL LOW (ref 30.00–100.00)

## 2024-02-12 MED ORDER — NICOTINE POLACRILEX 4 MG MT GUM: 4.0000 mg | CHEWING_GUM | OROMUCOSAL | 0 refills | Status: AC | PRN

## 2024-02-12 MED ORDER — NICOTINE 14 MG/24HR TD PT24: 14.0000 mg | MEDICATED_PATCH | Freq: Every day | TRANSDERMAL | 0 refills | Status: DC

## 2024-02-12 NOTE — Patient Instructions (Addendum)
 Welcome to [facility]!  Thank you for choosing [place] for your Primary Care needs.   We offer in person and video appointments for your convenience.   You may call our office to schedule appointments, or you may schedule appointments with me through MyChart.   The best way to get in contact with me is via MyChart message.   This will get to me faster than a phone call, unless there is an emergency, then please call 911.  The lab is located downstairs in the Sports Medicine building, we also have xray available there.   Please take the chlorthalidone once daily in the morning.  We are checking labs today, will be in contact with any results that require further attention.  Check BP at home once daily in the mornings for the next month.   Follow up with me in a month to recheck blood pressures.  [employer] book/show

## 2024-02-12 NOTE — Progress Notes (Signed)
 New Patient Office Visit  Subjective    Patient ID: Derrick Harrell, male    DOB: 1976-08-28  Age: 48 y.o. MRN: [id number]  CC:  Chief Complaint  Patient presents with   Establish Care    Patient requesting lab work,     HPI Derrick Harrell presents to establish care today. He is fasting today. States that he has been prescribed bupropion, amlodipine, chlorthalidone as well as losartan.  States that the only medication that he takes is chlorthalidone when he feels like his blood pressure is elevated. Reports amlodipine is ineffective. Reports concern about his liver, kidneys.  Was seen in the ED with CT and had follow-up MRI that showed hemangiomas to the liver, right adrenal nodule. Reports GI bloating, muscle spasms and cramping, constipation, hematochezia at times. Reports previous colonoscopy had precancerous polyps, we do not have records, will request. He is requesting referral for another colonoscopy today as well. Drinks almost a 12 pack of beer, almost every day. Smokes almost 1 pack of cigarettes daily. Denies other concern today. Medical history as outlined above   Outpatient Encounter Medications as of 02/12/2024  Medication Sig   atorvastatin (LIPITOR) 20 MG tablet Take 20 mg by mouth at bedtime.   chlorthalidone (HYGROTON) 25 MG tablet Take 25 mg by mouth daily.   fluticasone (FLONASE) 50 MCG/ACT nasal spray SHAKE LIQUID AND USE 1 SPRAY IN EACH NOSTRIL ONCE DAILY   losartan (COZAAR) 25 MG tablet Take 25 mg by mouth daily.   nicotine (NICODERM CQ - DOSED IN MG/24 HOURS) 14 mg/24hr patch Place 1 patch (14 mg total) onto the skin daily.   nicotine polacrilex (NICORETTE) 4 MG gum Take 1 each (4 mg total) by mouth as needed for smoking cessation.   polyethylene glycol powder (GLYCOLAX/MIRALAX) 17 GM/SCOOP powder Take 17 g by mouth daily. (Patient taking differently: Take 17 g by mouth daily as needed.)   [DISCONTINUED] buPROPion (WELLBUTRIN SR) 150 MG 12 hr tablet Take 150 mg  by mouth 2 (two) times daily.   [DISCONTINUED] docusate sodium (COLACE) 100 MG capsule Take 1 capsule (100 mg total) by mouth every 12 (twelve) hours. (Patient not taking: Reported on 02/12/2024)   No facility-administered encounter medications on file as of 02/12/2024.    Past Medical History:  Diagnosis Date   Gout    Hypertension     History reviewed. No pertinent surgical history.  History reviewed. No pertinent family history.  Social History   Socioeconomic History   Marital status: Married    Spouse name: Not on file   Number of children: Not on file   Years of education: Not on file   Highest education level: Not on file  Occupational History   Not on file  Tobacco Use   Smoking status: Never   Smokeless tobacco: Never  Substance and Sexual Activity   Alcohol use: Yes   Drug use: Not Currently   Sexual activity: Not on file  Other Topics Concern   Not on file  Social History Narrative   Not on file   Social Drivers of Health   Financial Resource Strain: Not on file  Food Insecurity: Not on file  Transportation Needs: Not on file  Physical Activity: Not on file  Stress: Not on file  Social Connections: Not on file  Intimate Partner Violence: Not on file    ROS Per HPI      Objective    BP (!) 148/88 (BP Location: Left Arm, Patient Position: Sitting)  Pulse 80   Temp 99.1 F (37.3 C) (Temporal)   Ht 5\' 8"  (1.727 m)   Wt 260 lb (117.9 kg)   SpO2 96%   BMI 39.53 kg/m   Physical Exam Vitals and nursing note reviewed.  Constitutional:      General: He is not in acute distress.    Appearance: Normal appearance. He is obese.  HENT:     Head: Normocephalic and atraumatic.     Nose: No congestion.     Mouth/Throat:     Mouth: Mucous membranes are moist.     Pharynx: Oropharynx is clear. No oropharyngeal exudate or posterior oropharyngeal erythema.  Eyes:     Extraocular Movements: Extraocular movements intact.  Cardiovascular:     Rate and  Rhythm: Normal rate and regular rhythm.     Pulses: Normal pulses.     Heart sounds: Normal heart sounds.  Pulmonary:     Effort: Pulmonary effort is normal. No respiratory distress.     Breath sounds: Normal breath sounds. No wheezing, rhonchi or rales.  Abdominal:     General: There is no distension.     Palpations: There is no mass.     Tenderness: There is no abdominal tenderness.  Musculoskeletal:        General: Normal range of motion.     Cervical back: Normal range of motion.     Right lower leg: No edema.     Left lower leg: No edema.  Lymphadenopathy:     Cervical: No cervical adenopathy.  Skin:    General: Skin is warm and dry.  Neurological:     General: No focal deficit present.     Mental Status: He is alert and oriented to person, place, and time.  Psychiatric:        Mood and Affect: Mood normal.        Behavior: Behavior normal.        Assessment & Plan:   Primary hypertension -     CBC with Differential/Platelet -     Comprehensive metabolic panel  Aortic atherosclerosis (HCC) -     CBC with Differential/Platelet -     Lipid panel  Adrenal nodule (HCC)  Hepatic hemangioma -     Ambulatory referral to Gastroenterology  Mixed hyperlipidemia -     Lipid panel  Tobacco use -     Nicotine; Place 1 patch (14 mg total) onto the skin daily.  Dispense: 28 patch; Refill: 0 -     Nicotine Polacrilex; Take 1 each (4 mg total) by mouth as needed for smoking cessation.  Dispense: 100 tablet; Refill: 0  Alcohol use disorder -     CBC with Differential/Platelet -     Comprehensive metabolic panel -     Vitamin B1 -     Vitamin B12 -     VITAMIN D 25 Hydroxy (Vit-D Deficiency, Fractures) -     Lipid panel -     Ambulatory referral to Gastroenterology -     Magnesium  Hiatal hernia -     Ambulatory referral to Gastroenterology  Umbilical hernia without obstruction and without gangrene -     Ambulatory referral to Gastroenterology  Hematochezia -      Ambulatory referral to Gastroenterology  Constipation, unspecified constipation type -     Ambulatory referral to Gastroenterology  Medication management -     Comprehensive metabolic panel -     Vitamin B1 -     Vitamin B12 -  VITAMIN D 25 Hydroxy (Vit-D Deficiency, Fractures) -     Magnesium  Follow up with specialists as referred Check BP at home once daily in the mornings for the next month. Follow up with me in a month to recheck blood pressures.   Return in about 4 weeks (around 03/11/2024) for BP, meds check.   Moshe Cipro, FNP

## 2024-02-13 ENCOUNTER — Encounter: Payer: Self-pay | Admitting: Family Medicine

## 2024-02-13 MED ORDER — VITAMIN D (ERGOCALCIFEROL) 1.25 MG (50000 UNIT) PO CAPS: 50000.0000 [IU] | ORAL_CAPSULE | ORAL | 0 refills | Status: DC

## 2024-02-13 NOTE — Addendum Note (Signed)
 Addended by: Sherald Barge on: 02/13/2024 12:55 PM   Modules accepted: Orders

## 2024-02-16 LAB — VITAMIN B1: Vitamin B1 (Thiamine): 9 nmol/L (ref 8–30)

## 2024-03-11 ENCOUNTER — Ambulatory Visit: Admitting: Family Medicine

## 2024-03-11 NOTE — Progress Notes (Deleted)
   Established Patient Office Visit  Subjective   Patient ID: Derrick Harrell, male    DOB: 1976-04-20  Age: 48 y.o. MRN: [id number]  No chief complaint on file.   HPI Patient presents today for medication management, and to recheck blood pressures.   Reports compliance with medication regimen. Denies the need for refills. Not fasting today. Denies other concerns. Medical history as outlined below.  ROS Per HPI    Objective:     There were no vitals taken for this visit.  Physical Exam Vitals and nursing note reviewed.  Constitutional:      General: He is not in acute distress.    Appearance: Normal appearance.  HENT:     Head: Normocephalic and atraumatic.     Right Ear: External ear normal.     Left Ear: External ear normal.     Nose: Nose normal.     Mouth/Throat:     Mouth: Mucous membranes are moist.     Pharynx: Oropharynx is clear.  Eyes:     Extraocular Movements: Extraocular movements intact.  Cardiovascular:     Rate and Rhythm: Normal rate and regular rhythm.     Pulses: Normal pulses.     Heart sounds: Normal heart sounds.  Pulmonary:     Effort: Pulmonary effort is normal. No respiratory distress.     Breath sounds: Normal breath sounds. No wheezing, rhonchi or rales.  Musculoskeletal:        General: Normal range of motion.     Cervical back: Normal range of motion.     Right lower leg: No edema.     Left lower leg: No edema.  Lymphadenopathy:     Cervical: No cervical adenopathy.  Skin:    General: Skin is warm and dry.  Neurological:     General: No focal deficit present.     Mental Status: He is alert and oriented to person, place, and time.  Psychiatric:        Mood and Affect: Mood normal.        Behavior: Behavior normal.      No results found for any visits on 03/11/24.   The 10-year ASCVD risk score (Arnett DK, et al., 2019) is: 10.2%    Assessment & Plan:   There are no diagnoses linked to this encounter.   No follow-ups  on file.    Wellington Half, FNP

## 2024-03-18 ENCOUNTER — Encounter: Payer: Self-pay | Admitting: Family Medicine

## 2024-03-18 ENCOUNTER — Ambulatory Visit (INDEPENDENT_AMBULATORY_CARE_PROVIDER_SITE_OTHER): Admitting: Family Medicine

## 2024-03-18 VITALS — BP 152/100 | HR 81 | Temp 98.6°F | Ht 68.0 in | Wt 254.8 lb

## 2024-03-18 DIAGNOSIS — I10 Essential (primary) hypertension: Secondary | ICD-10-CM | POA: Diagnosis not present

## 2024-03-18 DIAGNOSIS — I700 Atherosclerosis of aorta: Secondary | ICD-10-CM

## 2024-03-18 DIAGNOSIS — E559 Vitamin D deficiency, unspecified: Secondary | ICD-10-CM | POA: Insufficient documentation

## 2024-03-18 DIAGNOSIS — F1090 Alcohol use, unspecified, uncomplicated: Secondary | ICD-10-CM | POA: Diagnosis not present

## 2024-03-18 DIAGNOSIS — E782 Mixed hyperlipidemia: Secondary | ICD-10-CM | POA: Diagnosis not present

## 2024-03-18 MED ORDER — VITAMIN D (ERGOCALCIFEROL) 1.25 MG (50000 UNIT) PO CAPS: 50000.0000 [IU] | ORAL_CAPSULE | ORAL | 0 refills | Status: AC

## 2024-03-18 NOTE — Assessment & Plan Note (Signed)
 Continue current medication regimen, efforts in low-salt diet, weight loss Discussed to check blood pressures at home, does not currently have a cuff, states he will try to get one

## 2024-03-18 NOTE — Patient Instructions (Addendum)
 Continue current medication regimen.  F/u in 1 month follow up.

## 2024-03-18 NOTE — Assessment & Plan Note (Signed)
 Continue working on cutting down drinks by at least 1 drink a day

## 2024-03-18 NOTE — Assessment & Plan Note (Signed)
 Continue statin, continue working on smoking cessation

## 2024-03-18 NOTE — Progress Notes (Signed)
    Established Patient Office Visit  Subjective   Patient ID: Derrick Harrell, male    DOB: 07-Oct-1976  Age: 48 y.o. MRN: [id number]  Chief Complaint  Patient presents with   Hypertension    HPI Patient presents today for medication management, and to recheck blood pressures.  Has not been checking BP at home. Reports usual compliance with medication regimen, has not taken medication today. Usually takes it in the evenings, before work. 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 Denies HA, chest pain, vision changes, lower leg swelling, changes in urinary output, chest pain, palpitations.  Has lost 6#. Has not been able to quit smoking. States that he will try nicotine  inhalers to wean.  Denies the need for refills. Denies other concerns. Medical history as outlined below.  ROS Per HPI    Objective:     BP (!) 152/100 (BP Location: Left Arm, Patient Position: Sitting)   Pulse 81   Temp 98.6 F (37 C) (Temporal)   Ht 5\' 8"  (1.727 m)   Wt 254 lb 12.8 oz (115.6 kg)   SpO2 97%   BMI 38.74 kg/m   Physical Exam Vitals and nursing note reviewed.  Constitutional:      General: He is not in acute distress.    Appearance: Normal appearance. He is obese.  HENT:     Head: Normocephalic and atraumatic.  Cardiovascular:     Rate and Rhythm: Normal rate and regular rhythm.     Pulses: Normal pulses.     Heart sounds: Normal heart sounds.  Pulmonary:     Effort: Pulmonary effort is normal. No respiratory distress.     Breath sounds: Normal breath sounds. No wheezing, rhonchi or rales.  Musculoskeletal:        General: Normal range of motion.     Cervical back: Normal range of motion.     Right lower leg: No edema.     Left lower leg: No edema.  Lymphadenopathy:     Cervical: No cervical adenopathy.  Skin:    General: Skin is warm and dry.  Neurological:     General: No focal deficit present.     Mental Status: He is alert and  oriented to person, place, and time.  Psychiatric:        Mood and Affect: Mood normal.        Behavior: Behavior normal.      No results found for any visits on 03/18/24.   The 10-year ASCVD risk score (Arnett DK, et al., 2019) is: 10.7%    Assessment & Plan:   Primary hypertension Assessment & Plan: Continue current medication regimen, efforts in low-salt diet, weight loss Discussed to check blood pressures at home, does not currently have a cuff, states he will try to get one   Aortic atherosclerosis (HCC) Assessment & Plan: Continue statin, continue working on smoking cessation   Mixed hyperlipidemia Assessment & Plan: Continue statin, continue working on low-fat diet and healthy activity level   Alcohol use disorder Assessment & Plan: Continue working on cutting down drinks by at least 1 drink a day   Vitamin D  deficiency -     Vitamin D  (Ergocalciferol ); Take 1 capsule (50,000 Units total) by mouth every 7 (seven) days.  Dispense: 8 capsule; Refill: 0     Return in about 4 weeks (around 04/15/2024) for med check, labs.    Wellington Half, FNP

## 2024-03-18 NOTE — Assessment & Plan Note (Signed)
 Continue statin, continue working on low-fat diet and healthy activity level

## 2024-04-16 ENCOUNTER — Encounter: Payer: Self-pay | Admitting: Family Medicine

## 2024-04-19 NOTE — Progress Notes (Unsigned)
   Established Patient Office Visit  Subjective   Patient ID: Derrick Harrell, male    DOB: 23-Jul-1976  Age: 48 y.o. MRN: [id number]  No chief complaint on file.   HPI Patient presents today for medication management and HTN follow up. Reports compliance with medication regimen. Denies the need for refills. Not fasting today. Denies other concerns. Medical history as outlined below.  ROS Per HPI    Objective:     There were no vitals taken for this visit.  Physical Exam Vitals and nursing note reviewed.  Constitutional:      General: He is not in acute distress.    Appearance: Normal appearance.  HENT:     Head: Normocephalic and atraumatic.     Right Ear: External ear normal.     Left Ear: External ear normal.     Nose: Nose normal.     Mouth/Throat:     Mouth: Mucous membranes are moist.     Pharynx: Oropharynx is clear.  Eyes:     Extraocular Movements: Extraocular movements intact.  Neck:     Vascular: No carotid bruit.  Cardiovascular:     Rate and Rhythm: Normal rate and regular rhythm.     Pulses: Normal pulses.     Heart sounds: Normal heart sounds.  Pulmonary:     Effort: Pulmonary effort is normal. No respiratory distress.     Breath sounds: Normal breath sounds. No wheezing, rhonchi or rales.  Musculoskeletal:        General: Normal range of motion.     Cervical back: Normal range of motion.     Right lower leg: No edema.     Left lower leg: No edema.  Lymphadenopathy:     Cervical: No cervical adenopathy.  Skin:    General: Skin is warm and dry.  Neurological:     General: No focal deficit present.     Mental Status: He is alert and oriented to person, place, and time.  Psychiatric:        Mood and Affect: Mood normal.        Behavior: Behavior normal.    No results found for any visits on 04/21/24.   The 10-year ASCVD risk score (Arnett DK, et al., 2019) is: 10.7%    Assessment & Plan:   Primary hypertension  Aortic atherosclerosis  (HCC)  Tobacco use  Alcohol use disorder     No follow-ups on file.    Wellington Half, FNP

## 2024-04-21 ENCOUNTER — Encounter: Payer: Self-pay | Admitting: Family Medicine

## 2024-04-21 ENCOUNTER — Ambulatory Visit (INDEPENDENT_AMBULATORY_CARE_PROVIDER_SITE_OTHER): Admitting: Family Medicine

## 2024-04-21 VITALS — BP 180/108 | HR 85 | Ht 68.0 in | Wt 260.8 lb

## 2024-04-21 DIAGNOSIS — I700 Atherosclerosis of aorta: Secondary | ICD-10-CM

## 2024-04-21 DIAGNOSIS — Z6839 Body mass index (BMI) 39.0-39.9, adult: Secondary | ICD-10-CM

## 2024-04-21 DIAGNOSIS — F1090 Alcohol use, unspecified, uncomplicated: Secondary | ICD-10-CM

## 2024-04-21 DIAGNOSIS — F411 Generalized anxiety disorder: Secondary | ICD-10-CM | POA: Insufficient documentation

## 2024-04-21 DIAGNOSIS — I10 Essential (primary) hypertension: Secondary | ICD-10-CM | POA: Diagnosis not present

## 2024-04-21 DIAGNOSIS — E66812 Obesity, class 2: Secondary | ICD-10-CM | POA: Insufficient documentation

## 2024-04-21 DIAGNOSIS — Z720 Tobacco use: Secondary | ICD-10-CM

## 2024-04-21 NOTE — Assessment & Plan Note (Signed)
 Discussed healthy diet and activity level

## 2024-04-21 NOTE — Assessment & Plan Note (Signed)
 Discussed importance of medication compliance Says he will go straight from here and pick up his medication begin to take it

## 2024-04-21 NOTE — Assessment & Plan Note (Signed)
 Discussed risk of stroke given blood pressure, alcohol use, tobacco use, stress

## 2024-04-21 NOTE — Assessment & Plan Note (Signed)
 Referral to psych for therapy today

## 2024-04-21 NOTE — Assessment & Plan Note (Signed)
 Not ready to quit or cut back.

## 2024-04-21 NOTE — Patient Instructions (Signed)
 I have sent a referral for therapy for you. Someone will be reaching out to get you scheduled.   Please start taking your blood pressure medications.   Follow up with me in one month for evaluation of medication effectiveness.

## 2024-04-21 NOTE — Assessment & Plan Note (Signed)
 Not ready to quit.

## 2024-05-25 ENCOUNTER — Other Ambulatory Visit: Payer: Self-pay | Admitting: Family Medicine

## 2024-05-25 DIAGNOSIS — E559 Vitamin D deficiency, unspecified: Secondary | ICD-10-CM

## 2024-07-16 NOTE — Progress Notes (Deleted)
 Psychiatric Initial Adult Assessment   Patient Identification: Derrick Harrell MRN:  [id number] Date of Evaluation:  07/16/2024 Referral Source: PCP Chief Complaint:  No chief complaint on file.  Visit Diagnosis: No diagnosis found.   Assessment:  Derrick Harrell is a 48 y.o. male with a history of anxiety, alcohol use disorder, tobacco use who presents in person to [facility] at [facility] for initial evaluation on 07/16/2024.    At initial evaluation patient reports ***  A number of assessments were performed during the evaluation today including  PHQ-9 which they scored a *** on, GAD-7 which they scored a *** on, and [place] suicide severity screening which showed ***.    Risk Assessment: A suicide and violence risk assessment was performed as part of this evaluation. There patient is deemed to be at chronic elevated risk for self-harm/suicide given the following factors: {SABSUICIDERISKFACTORS:29780}. These risk factors are mitigated by the following factors: {SABSUICIDEPROTECTIVEFACTORS:29779}. The patient is deemed to be at chronic elevated risk for violence given the following factors: {SABVIOLENCERISKFACTORS:29781}. These risk factors are mitigated by the following factors: {SABVIOLENCEPROTECTIVEFACTORS:29782}. There is no  acute risk for suicide or violence at this time. The patient was educated about relevant modifiable risk factors including following recommendations for treatment of psychiatric illness and abstaining from substance abuse.  While future psychiatric events cannot be accurately predicted, the patient does not  currently require  acute inpatient psychiatric care and does not  currently meet [place]  involuntary commitment criteria.  Patient was given contact information for crisis resources, behavioral health clinic and was instructed to call 911 for emergencies.    Plan: # *** Past medication trials:  Status of problem: *** Interventions: --  ***  # *** Past medication trials:  Status of problem: *** Interventions: -- ***  # *** Past medication trials:  Status of problem: *** Interventions: -- ***   History of Present Illness:  ***  Associated Signs/Symptoms: Depression Symptoms:  {DEPRESSION SYMPTOMS:20000} (Hypo) Manic Symptoms:  None Anxiety Symptoms:  {BHH ANXIETY SYMPTOMS:22873} Psychotic Symptoms:  None PTSD Symptoms: None  Past Psychiatric History:  Past psychiatric diagnoses: Anxiety, alcohol use disorder Psychiatric hospitalizations: Denies Past suicide attempts: Denies Hx of self harm: Denies Hx of violence towards others: Denies Prior psychiatric providers: None Prior therapy: None Access to firearms: Denies  Prior medication trials: None  Substance use: ***  Past Medical History:  Past Medical History:  Diagnosis Date   Gout    Hypertension    No past surgical history on file.  Family Psychiatric History: ***  Family History: No family history on file.  Social History:   Social History   Socioeconomic History   Marital status: Married    Spouse name: Not on file   Number of children: Not on file   Years of education: Not on file   Highest education level: Not on file  Occupational History   Not on file  Tobacco Use   Smoking status: Never   Smokeless tobacco: Never  Substance and Sexual Activity   Alcohol use: Yes   Drug use: Not Currently   Sexual activity: Not on file  Other Topics Concern   Not on file  Social History Narrative   Not on file   Social Drivers of Health   Financial Resource Strain: Not on file  Food Insecurity: Not on file  Transportation Needs: Not on file  Physical Activity: Not on file  Stress: Not on file  Social Connections: Not on file  Additional Social History: ***  Allergies:   Allergies  Allergen Reactions   Amlodipine     Not effective    Metabolic Disorder Labs: No results found for: HGBA1C, MPG No results found  for: PROLACTIN Lab Results  Component Value Date   CHOL 242 (H) 02/12/2024   TRIG 104.0 02/12/2024   HDL 54.10 02/12/2024   CHOLHDL 4 02/12/2024   VLDL 20.8 02/12/2024   LDLCALC 168 (H) 02/12/2024   No results found for: TSH  Therapeutic Level Labs: No results found for: LITHIUM No results found for: CBMZ No results found for: VALPROATE  Current Medications: Current Outpatient Medications  Medication Sig Dispense Refill   atorvastatin (LIPITOR) 20 MG tablet Take 20 mg by mouth at bedtime.     chlorthalidone (HYGROTON) 25 MG tablet Take 25 mg by mouth daily. (Patient not taking: Reported on 04/21/2024)     fluticasone (FLONASE) 50 MCG/ACT nasal spray SHAKE LIQUID AND USE 1 SPRAY IN EACH NOSTRIL ONCE DAILY     losartan (COZAAR) 25 MG tablet Take 25 mg by mouth daily. (Patient not taking: Reported on 04/21/2024)     nicotine  (NICODERM CQ  - DOSED IN MG/24 HOURS) 14 mg/24hr patch Place 1 patch (14 mg total) onto the skin daily. 28 patch 0   nicotine  polacrilex (NICORETTE ) 4 MG gum Take 1 each (4 mg total) by mouth as needed for smoking cessation. 100 tablet 0   polyethylene glycol powder (GLYCOLAX /MIRALAX ) 17 GM/SCOOP powder Take 17 g by mouth daily. (Patient taking differently: Take 17 g by mouth daily as needed.) 255 g 0   Vitamin D , Ergocalciferol , (DRISDOL ) 1.25 MG (50000 UNIT) CAPS capsule Take 1 capsule (50,000 Units total) by mouth every 7 (seven) days. 8 capsule 0   No current facility-administered medications for this visit.    Musculoskeletal: Strength & Muscle Tone: within normal limits Gait & Station: normal Patient leans: N/A  Psychiatric Specialty Exam:  Psychiatric Specialty Exam: There were no vitals taken for this visit.There is no height or weight on file to calculate BMI. Review of Systems  General Appearance: Casual  Eye Contact:  Good  Speech:  Clear and Coherent and Normal Rate  Volume:  Normal  Mood:  {BHH MOOD:22306}  Affect:  Congruent   Thought Content: Logical   Suicidal Thoughts:  No  Homicidal Thoughts:  No  Thought Process:  Linear  Orientation:  Full (Time, Place, and Person)    Memory: Immediate;   Good Recent;   Good Remote;   Good  Judgment:  Fair  Insight:  Fair  Concentration:  Concentration: Good and Attention Span: Good  Recall:  not formally assessed   Fund of Knowledge: Good  Language: Fair  Psychomotor Activity:  Normal  Akathisia:  No  AIMS (if indicated): not done  Assets:  Communication Skills Desire for Improvement Financial Resources/Insurance Housing Intimacy Leisure Time Physical Health Resilience  ADL's:  Intact  Cognition: WNL  Sleep:  {BHH GOOD/FAIR/POOR:22877}    Screenings: Oceanographer Row Office Visit from 02/12/2024 in [facility] [place]  PHQ-2 Total Score 0   Flowsheet Row ED from 08/21/2023 in [facility] Department at [facility] ED from [facility]/01/2021 in [facility] Emergency Department at [facility]  C-SSRS RISK CATEGORY No Risk No Risk     Collaboration of Care: [facility] OP Collaboration of Rjmz:[id number]}  Patient/Guardian was advised Release of Information must be obtained prior to any record release in order to  collaborate their care with an outside provider. Patient/Guardian was advised if they have not already done so to contact the registration department to sign all necessary forms in order for us  to release information regarding their care.   Consent: Patient/Guardian gives verbal consent for treatment and assignment of benefits for services provided during this visit. Patient/Guardian expressed understanding and agreed to proceed.   Sarkis Rhines, MD 8/21/20252:42 PM

## 2024-07-28 ENCOUNTER — Ambulatory Visit (HOSPITAL_COMMUNITY): Payer: Self-pay

## 2024-08-12 ENCOUNTER — Ambulatory Visit (HOSPITAL_COMMUNITY): Payer: Self-pay | Admitting: Mental Health

## 2024-08-12 DIAGNOSIS — F1020 Alcohol dependence, uncomplicated: Secondary | ICD-10-CM

## 2024-08-12 DIAGNOSIS — F322 Major depressive disorder, single episode, severe without psychotic features: Secondary | ICD-10-CM

## 2024-08-14 DIAGNOSIS — F322 Major depressive disorder, single episode, severe without psychotic features: Secondary | ICD-10-CM | POA: Insufficient documentation

## 2024-08-14 DIAGNOSIS — F1020 Alcohol dependence, uncomplicated: Secondary | ICD-10-CM | POA: Insufficient documentation
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
## 2024-08-18 ENCOUNTER — Ambulatory Visit (HOSPITAL_COMMUNITY): Admitting: Physician Assistant

## 2024-09-07 ENCOUNTER — Encounter: Payer: Self-pay | Admitting: Physician Assistant

## 2024-09-08 ENCOUNTER — Encounter: Payer: Self-pay | Admitting: Family Medicine

## 2024-09-08 ENCOUNTER — Ambulatory Visit (INDEPENDENT_AMBULATORY_CARE_PROVIDER_SITE_OTHER): Admitting: Family Medicine

## 2024-09-08 ENCOUNTER — Ambulatory Visit: Payer: Self-pay

## 2024-09-08 VITALS — BP 178/100 | HR 100 | Temp 98.5°F | Wt 254.5 lb

## 2024-09-08 DIAGNOSIS — I10 Essential (primary) hypertension: Secondary | ICD-10-CM

## 2024-09-08 DIAGNOSIS — K921 Melena: Secondary | ICD-10-CM | POA: Diagnosis not present

## 2024-09-08 NOTE — Telephone Encounter (Signed)
 FYI Only or Action Required?: FYI only for provider.  Patient was last seen in primary care on 04/21/2024 by Alvia Corean CROME, FNP.  Called Nurse Triage reporting Rectal Bleeding.  Symptoms began several days ago.  Interventions attempted: Nothing.  Symptoms are: rectal bleeding and blood in stool completely resolved today. Intermittent abdominal pain, intermittent dizziness.  Triage Disposition: See Physician Within 24 Hours  Patient/caregiver understands and will follow disposition?: Yes            Copied from CRM #[id number]. Topic: Clinical - Red Word Triage >> Sep 08, 2024 12:26 PM Eva FALCON wrote: Red Word that prompted transfer to Nurse Triage: Pt states he is having bloody stools/blood coming out of rectum. Reason for Disposition  MODERATE rectal bleeding (e.g., small blood clots, passing blood without stool, or toilet water turns red)  Answer Assessment - Initial Assessment Questions Patient poor historian, states he called in yesterday and got a GI referral. His chart states he has had a referral in since March of this year. Patient states he can't be seen at GI until December and is asking for a referral to be seen sooner. RN advised for patient to be seen today with a provider for symptoms due to bleeding x 2 days although it has resolved today and to help ease patient's anxiety. Patient very anxious on the phone and states he doesn't want a provider to be guessing what is wrong and he would like blood work and testing done. Patient educated that a provider will assess and order testing based on that assessment and discussion with him. He states he also needs FMLA paperwork.  1. APPEARANCE of BLOOD: What color is it? Is it passed separately, on the surface of the stool, or mixed in with the stool?      Dark red. Blood passing without stool and in stool. He states yesterday he was sitting on the bed and he bleed into his underwear and bed.  2. AMOUNT: How much  blood was passed?      He is having a hard time estimating how much blood. RN asked if more or less than a cup and he thinks less than a cup but can not answer.  3. FREQUENCY: How many times has blood been passed with the stools?      He states he has not checked himself today. RN asked if he is having constant bleeding or streaming of blood. He states he does not see any bleeding so far today. He states it was bleeding constantly and just stopped today.  4. ONSET: When was the blood first seen in the stools? (Days or weeks)      First patient states the bleeding started yesterday, then states it was 2 days ago.  5. DIARRHEA: Is there also some diarrhea? If Yes, ask: How many diarrhea stools in the past 24 hours?      No.  6. CONSTIPATION: Do you have constipation? If Yes, ask: How bad is it?     No.  7. RECURRENT SYMPTOMS: Have you had blood in your stools before? If Yes, ask: When was the last time? and What happened that time?      Yes, couple years ago. He states that time he didn't even know he had bleeding but his wife found blood in his underwear. He had a colonoscopy at that time and had some polyps removed.   8. BLOOD THINNERS: Do you take any blood thinners? (e.g., aspirin, clopidogrel / Plavix, coumadin,  heparin). Notes: Other strong blood thinners include: Arixtra (fondaparinux), Eliquis (apixaban), Pradaxa (dabigatran), and Xarelto (rivaroxaban).     No.  9. OTHER SYMPTOMS: Do you have any other symptoms?  (e.g., abdomen pain, vomiting, dizziness, fever)     Denies pale, clammy, lightheaded, weakness, vomiting, LOC/syncope. He states he has dizziness intermittent. Abdominal pain 7/10 comes and goes, alternates between left and right sides. He states he has nausea 3 weeks ago.  10. PREGNANCY: Is there any chance you are pregnant? When was your last menstrual period?       N/A.  Protocols used: Rectal Bleeding-A-AH

## 2024-09-08 NOTE — Progress Notes (Signed)
 Established Patient Office Visit  Subjective   Patient ID: Derrick Harrell, male    DOB: [birth year]  Age: 48 y.o. MRN: [id number]  Chief Complaint  Patient presents with   Blood In Stools   Abdominal Pain    HPI   Derrick Harrell is seen as a work in with 2-day history of some bloody stools.  He states he had similar episode back in 2022.   He reportedly had colonoscopy at that time and apparently had a few polyps removed and was told to get follow-up colonoscopy in 6 to 8 months.  He has still not had follow-up colonoscopy at this point.  We do not have records of that previous study.  No family history of colon cancer.  2 days ago noted little bit of blood in his underwear and has had some consistent blood in stool since then.  Apparently did have internal hemorrhoids with prior colonoscopy.  No pain with stools.  No straining.  No dizziness.  No appetite changes.  Modest weight loss of about 6 pounds since May.  Blood pressure is very high today and he does have hypertension and has been treated with multiple medication in the past but apparently off all medications recently.  He has been delayed in getting these refilled.  He does have pending follow-up with GI December 2.  Past Medical History:  Diagnosis Date   Gout    Hypertension    History reviewed. No pertinent surgical history.  reports that he has never smoked. He has never used smokeless tobacco. He reports current alcohol use. He reports that he does not currently use drugs. family history is not on file. Allergies  Allergen Reactions   Amlodipine     Not effective    Review of Systems  Constitutional:  Negative for chills and fever.  Respiratory:  Negative for shortness of breath.   Cardiovascular:  Negative for chest pain.  Gastrointestinal:  Positive for blood in stool. Negative for constipation, melena, nausea and vomiting.  Neurological:  Negative for dizziness.      Objective:     BP (!) 178/100   Pulse  100   Temp 98.5 F (36.9 C) (Oral)   Wt 254 lb 8 oz (115.4 kg)   SpO2 96%   BMI 38.70 kg/m  BP Readings from Last 3 Encounters:  09/08/24 (!) 178/100  04/21/24 (!) 180/108  03/18/24 (!) 152/100   Wt Readings from Last 3 Encounters:  09/08/24 254 lb 8 oz (115.4 kg)  04/21/24 260 lb 12.8 oz (118.3 kg)  03/18/24 254 lb 12.8 oz (115.6 kg)      Physical Exam Vitals reviewed.  Constitutional:      General: He is not in acute distress.    Appearance: He is not ill-appearing.  Cardiovascular:     Rate and Rhythm: Normal rate and regular rhythm.  Pulmonary:     Effort: Pulmonary effort is normal.     Breath sounds: Normal breath sounds. No wheezing or rales.  Abdominal:     Palpations: Abdomen is soft. There is no hepatomegaly.     Tenderness: There is no abdominal tenderness. There is no guarding or rebound.  Genitourinary:    Comments: No visible external hemorrhoids.  No visible anal fissure.  Digital exam reveals no palpable masses.  Scant stool which is Hemoccult negative Neurological:     Mental Status: He is alert.      No results found for any visits on 09/08/24.  The 10-year ASCVD risk score (Arnett DK, et al., 2019) is: 14.9%    Assessment & Plan:   #1 reported 2-day history of hematochezia.  Patient reports prior history of colon polyps on colonoscopy 2022.  Cannot find record of that previous study.  Denies any pain with stools and no palpable rectal mass on exam today.  Patient does have pending follow-up with GI December 2  -Check CBC.  If showing evidence for anemia will try to expedite his GI referral - We recommend he try to get records from previous colonoscopy study - Follow-up immediately for any dizziness or creased rectal bleeding  #2 history of hypertension.  He had quite elevated blood pressure today but not taking any of his usual medications.  We recommend he get back on all of those with close follow-up with primary.  No follow-ups on file.     Derrick Scarlet, MD

## 2024-09-08 NOTE — Patient Instructions (Signed)
 Go for lab at [facility] on Thursday-- [address] in basement.

## 2024-09-10 ENCOUNTER — Encounter (HOSPITAL_COMMUNITY): Payer: Self-pay

## 2024-09-10 ENCOUNTER — Ambulatory Visit (INDEPENDENT_AMBULATORY_CARE_PROVIDER_SITE_OTHER)

## 2024-09-10 ENCOUNTER — Other Ambulatory Visit (INDEPENDENT_AMBULATORY_CARE_PROVIDER_SITE_OTHER)

## 2024-09-10 DIAGNOSIS — K921 Melena: Secondary | ICD-10-CM

## 2024-09-10 DIAGNOSIS — F322 Major depressive disorder, single episode, severe without psychotic features: Secondary | ICD-10-CM

## 2024-09-10 DIAGNOSIS — F1020 Alcohol dependence, uncomplicated: Secondary | ICD-10-CM | POA: Diagnosis not present

## 2024-09-10 LAB — CBC WITH DIFFERENTIAL/PLATELET
Basophils Absolute: 0 K/uL (ref 0.0–0.1)
Basophils Relative: 0.6 % (ref 0.0–3.0)
Eosinophils Absolute: 0.1 K/uL (ref 0.0–0.7)
Eosinophils Relative: 1 % (ref 0.0–5.0)
HCT: 49.4 % (ref 39.0–52.0)
Hemoglobin: 15.9 g/dL (ref 13.0–17.0)
Lymphocytes Relative: 41.9 % (ref 12.0–46.0)
Lymphs Abs: 3.3 K/uL (ref 0.7–4.0)
MCHC: 32.2 g/dL (ref 30.0–36.0)
MCV: 83.8 fl (ref 78.0–100.0)
Monocytes Absolute: 0.7 K/uL (ref 0.1–1.0)
Monocytes Relative: 8.2 % (ref 3.0–12.0)
Neutro Abs: 3.9 K/uL (ref 1.4–7.7)
Neutrophils Relative %: 48.3 % (ref 43.0–77.0)
Platelets: 236 K/uL (ref 150.0–400.0)
RBC: 5.89 Mil/uL — ABNORMAL HIGH (ref 4.22–5.81)
RDW: 15.4 % (ref 11.5–15.5)
WBC: 8 K/uL (ref 4.0–10.5)

## 2024-09-10 NOTE — Progress Notes (Signed)
 THERAPIST PROGRESS NOTE  Session Time: 1:58 pm to 2:52 pm  Type of Therapy: Individual   Therapist Response/Interventions: Active Listening   Goals:  Problem: Substance Use  Dates: Start:  09/10/24    Disciplines: Interdisciplinary, PROVIDER  Goal: Nancy will abstain from drugs and alcohol  for 30/30 AEB his report and UDS or breathalyzer if indicated  Dates: Start:  09/10/24   Expected End:  03/11/25    Disciplines: Interdisciplinary, PROVIDER  Goal: Eliazar will decrease his depression by reporting it no higher than a 4 consistently on the PHQ-9.  Dates: Start:  09/10/24   Expected End:  03/11/25    Disciplines: Interdisciplinary, PROVIDER  Intervention: Therapist will educate Eliazar about SUDS, patterns and consequences of use, relapse risks, the treatment process and types of mutual groups and provide early recovery and relapse prevention skills  Dates: Start:  09/10/24    Intervention: Therapist will assist Napoleon with identifying thoughts and behaviors that can contribute to depression.  Dates: Start:  09/10/24    Description: Dakhari gives this therapist verbal permission to electronically sign his Care Plan    Summary: Onstott presents with his wife today in person.  He says he wants to find a way to be sober and deal with life's problems.  He says he has let his family down. Vanderloop says he has never dealt with the problems from his childhood and they have impacted how he has treated everyone else in his life.  He reports he and his wife have 6 sons and 10 grandchildren, all who live in [place].  He reports having a rocky marriage. Fogleman says their 30 year old son is locked up in [place].  He reports his wife is getting therapy to deal with issues he feels he has caused her. Therapist suggests she go to [employer] and that Lindbloom go to [employer] or [employer] for support, rather than depending on his wife to be the sole support  Kohlmann says he has reached the point in his life where he is pursuing  help because he wants it for himself.    Suicidal/Homicidal: denies  Plan: Return on 10-13-24 at 3pm   Collaboration of Care: n/a  Patient/Guardian was advised Release of Information must be obtained prior to any record release in order to collaborate their care with an outside provider. Patient/Guardian was advised if they have not already done so to contact the registration department to sign all necessary forms in order for us  to release information regarding their care.   Consent: Patient/Guardian gives verbal consent for treatment and assignment of benefits for services provided during this visit. Patient/Guardian expressed understanding and agreed to proceed.   Darice Simpler, MS., LMFT, LCAS

## 2024-09-11 ENCOUNTER — Ambulatory Visit: Payer: Self-pay | Admitting: Family Medicine

## 2024-10-01 ENCOUNTER — Ambulatory Visit (INDEPENDENT_AMBULATORY_CARE_PROVIDER_SITE_OTHER): Admitting: Physician Assistant

## 2024-10-01 ENCOUNTER — Encounter (HOSPITAL_COMMUNITY): Payer: Self-pay | Admitting: Physician Assistant

## 2024-10-01 VITALS — BP 168/99 | HR 90 | Temp 98.4°F | Ht 68.0 in | Wt 257.0 lb

## 2024-10-01 DIAGNOSIS — F1020 Alcohol dependence, uncomplicated: Secondary | ICD-10-CM

## 2024-10-01 DIAGNOSIS — Z720 Tobacco use: Secondary | ICD-10-CM | POA: Diagnosis not present

## 2024-10-01 DIAGNOSIS — F411 Generalized anxiety disorder: Secondary | ICD-10-CM

## 2024-10-01 DIAGNOSIS — F322 Major depressive disorder, single episode, severe without psychotic features: Secondary | ICD-10-CM

## 2024-10-01 MED ORDER — NICOTINE 21 MG/24HR TD PT24: 21.0000 mg | MEDICATED_PATCH | Freq: Every day | TRANSDERMAL | 1 refills | Status: DC
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
## 2024-10-13 ENCOUNTER — Ambulatory Visit (HOSPITAL_COMMUNITY)

## 2024-10-21 ENCOUNTER — Telehealth (HOSPITAL_COMMUNITY): Payer: Self-pay

## 2024-10-21 NOTE — Telephone Encounter (Signed)
 Therapist receives a message from this pt saying he was sorry he missed his last appointment. He says he had a lot of appointments going on at that time. Therapist calls him back to reschedule and reaches voicemail. Therapist leaves a HIPAA compliant message requesting a return call.  Darice Simpler, MS, LMFT, LCAS

## 2024-10-26 NOTE — Progress Notes (Deleted)
 Chief Complaint: Blood in stool  HPI:    Derrick Harrell is a 48 year old male with a past medical history as listed below including depression and alcohol use disorder, who was referred to me by Alvia Corean CROME, * for a complaint of blood in stool.      08/21/2023 CTAP with contrast for left lower quadrant pain with no acute inflammatory process.  Multiple hypoattenuating lesions in the liver.  Recommended MRI.    10/10/2023 MRI of the liver with and without contrast showed small by lobar hepatic lesions corresponding to those seen on CT consistent with benign hemangiomas.    09/08/2024 office visit with PCP for hematochezia.  At that time discussed he had reportedly had a colonoscopy around 2022 when he had blood in his stools and was told to get follow-up in 6 to 8 months after a few polyps removed but he never did.  At that time discussed a little bit of blood in his underwear.  Blood pressure very high that day 178/100.  Rectal exam with scant stool which is Hemoccult negative.    09/10/2024 CBC with an elevated RBC and otherwise normal.  Past Medical History:  Diagnosis Date   Gout    Hypertension     No past surgical history on file.  Current Outpatient Medications  Medication Sig Dispense Refill   atorvastatin (LIPITOR) 20 MG tablet Take 20 mg by mouth at bedtime. (Patient not taking: Reported on 09/08/2024)     chlorthalidone (HYGROTON) 25 MG tablet Take 25 mg by mouth daily. (Patient not taking: Reported on 09/08/2024)     fluticasone (FLONASE) 50 MCG/ACT nasal spray SHAKE LIQUID AND USE 1 SPRAY IN EACH NOSTRIL ONCE DAILY (Patient not taking: Reported on 09/08/2024)     losartan (COZAAR) 25 MG tablet Take 25 mg by mouth daily. (Patient not taking: Reported on 09/08/2024)     nicotine  (NICODERM CQ  - DOSED IN MG/24 HOURS) 21 mg/24hr patch Place 1 patch (21 mg total) onto the skin daily. 28 patch 1   nicotine  polacrilex (NICORETTE ) 4 MG gum Take 1 each (4 mg total) by mouth as  needed for smoking cessation. (Patient not taking: Reported on 09/08/2024) 100 tablet 0   polyethylene glycol powder (GLYCOLAX /MIRALAX ) 17 GM/SCOOP powder Take 17 g by mouth daily. (Patient not taking: Reported on 09/08/2024) 255 g 0   Vitamin D , Ergocalciferol , (DRISDOL ) 1.25 MG (50000 UNIT) CAPS capsule Take 1 capsule (50,000 Units total) by mouth every 7 (seven) days. (Patient not taking: Reported on 09/08/2024) 8 capsule 0   No current facility-administered medications for this visit.    Allergies as of 10/27/2024 - Review Complete 10/01/2024  Allergen Reaction Noted   Amlodipine  02/12/2024    No family history on file.  Social History   Socioeconomic History   Marital status: Married    Spouse name: Not on file   Number of children: Not on file   Years of education: Not on file   Highest education level: Not on file  Occupational History   Not on file  Tobacco Use   Smoking status: Never   Smokeless tobacco: Never  Substance and Sexual Activity   Alcohol use: Yes   Drug use: Not Currently   Sexual activity: Not on file  Other Topics Concern   Not on file  Social History Narrative   Not on file   Social Drivers of Health   Financial Resource Strain: High Risk (08/12/2024)   Overall Financial Resource Strain (CARDIA)  Difficulty of Paying Living Expenses: Hard  Food Insecurity: Not on file  Transportation Needs: Not on file  Physical Activity: Not on file  Stress: Not on file  Social Connections: Not on file  Intimate Partner Violence: Not on file    Review of Systems:    Constitutional: No weight loss, fever, chills, weakness or fatigue HEENT: Eyes: No change in vision               Ears, Nose, Throat:  No change in hearing or congestion Skin: No rash or itching Cardiovascular: No chest pain, chest pressure or palpitations   Respiratory: No SOB or cough Gastrointestinal: See HPI and otherwise negative Genitourinary: No dysuria or change in urinary  frequency Neurological: No headache, dizziness or syncope Musculoskeletal: No new muscle or joint pain Hematologic: No bleeding or bruising Psychiatric: No history of depression or anxiety    Physical Exam:  Vital signs: There were no vitals taken for this visit.  Constitutional:   Pleasant Caucasian male appears to be in NAD, Well developed, Well nourished, alert and cooperative Head:  Normocephalic and atraumatic. Eyes:   PEERL, EOMI. No icterus. Conjunctiva pink. Ears:  Normal auditory acuity. Neck:  Supple Throat: Oral cavity and pharynx without inflammation, swelling or lesion.  Respiratory: Respirations even and unlabored. Lungs clear to auscultation bilaterally.   No wheezes, crackles, or rhonchi.  Cardiovascular: Normal S1, S2. No MRG. Regular rate and rhythm. No peripheral edema, cyanosis or pallor.  Gastrointestinal:  Soft, nondistended, nontender. No rebound or guarding. Normal bowel sounds. No appreciable masses or hepatomegaly. Rectal:  Not performed.  Msk:  Symmetrical without gross deformities. Without edema, no deformity or joint abnormality.  Neurologic:  Alert and  oriented x4;  grossly normal neurologically.  Skin:   Dry and intact without significant lesions or rashes. Psychiatric: Oriented to person, place and time. Demonstrates good judgement and reason without abnormal affect or behaviors.  RELEVANT LABS AND IMAGING: CBC    Component Value Date/Time   WBC 8.0 09/10/2024 1639   RBC 5.89 (H) 09/10/2024 1639   HGB 15.9 09/10/2024 1639   HCT 49.4 09/10/2024 1639   PLT 236.0 09/10/2024 1639   MCV 83.8 09/10/2024 1639   MCH 26.2 08/21/2023 1732   MCHC 32.2 09/10/2024 1639   RDW 15.4 09/10/2024 1639   LYMPHSABS 3.3 09/10/2024 1639   MONOABS 0.7 09/10/2024 1639   EOSABS 0.1 09/10/2024 1639   BASOSABS 0.0 09/10/2024 1639    CMP     Component Value Date/Time   NA 138 02/12/2024 1056   K 4.1 02/12/2024 1056   CL 105 02/12/2024 1056   CO2 26 02/12/2024  1056   GLUCOSE 96 02/12/2024 1056   BUN 11 02/12/2024 1056   CREATININE 0.81 02/12/2024 1056   CALCIUM 9.7 02/12/2024 1056   PROT 7.5 02/12/2024 1056   ALBUMIN 4.4 02/12/2024 1056   AST 16 02/12/2024 1056   ALT 20 02/12/2024 1056   ALKPHOS 59 02/12/2024 1056   BILITOT 0.4 02/12/2024 1056   GFRNONAA >60 08/21/2023 1732    Assessment: 1.  Hematochezia: Rectal exam by PCP with no obvious etiology, discusses previous history of the same with a colonoscopy in 2022 showing polyps and repeat recommended in 6 to 8 months (do not have reports), CBC checked by PCP and normal 2.  Hypertension: At last office visit blood pressure was high but had not taken any regular medicines  Plan: 1. ***     Delon Failing, PA-C [facility] 10/26/2024,  2:08 PM  Cc: Alvia Corean CROME, *

## 2024-10-27 ENCOUNTER — Ambulatory Visit: Admitting: Physician Assistant

## 2024-11-03 ENCOUNTER — Ambulatory Visit: Admitting: Gastroenterology

## 2024-11-03 ENCOUNTER — Encounter: Payer: Self-pay | Admitting: Gastroenterology

## 2024-11-03 VITALS — BP 164/98 | HR 92 | Ht 68.0 in | Wt 259.6 lb

## 2024-11-03 DIAGNOSIS — K625 Hemorrhage of anus and rectum: Secondary | ICD-10-CM

## 2024-11-03 DIAGNOSIS — Z860100 Personal history of colon polyps, unspecified: Secondary | ICD-10-CM

## 2024-11-03 MED ORDER — NA SULFATE-K SULFATE-MG SULF 17.5-3.13-1.6 GM/177ML PO SOLN: 1.0000 | Freq: Once | ORAL | 0 refills | Status: AC

## 2024-11-03 NOTE — Patient Instructions (Addendum)
 _______________________________________________________  If your blood pressure at your visit was 140/90 or greater, please contact your primary care physician to follow up on this.  _______________________________________________________  If you are age 48 or older, your body mass index should be between 23-30. Your Body mass index is 39.47 kg/m. If this is out of the aforementioned range listed, please consider follow up with your Primary Care Provider.  If you are age 79 or younger, your body mass index should be between 19-25. Your Body mass index is 39.47 kg/m. If this is out of the aformentioned range listed, please consider follow up with your Primary Care Provider.   ________________________________________________________  The  GI providers would like to encourage you to use MYCHART to communicate with providers for non-urgent requests or questions.  Due to long hold times on the telephone, sending your provider a message by [facility] may be a faster and more efficient way to get a response.  Please allow 48 business hours for a response.  Please remember that this is for non-urgent requests.  _______________________________________________________  [facility] is using a team-based approach to care.  Your team is made up of your doctor and two to three APPS. Our APPS (Nurse Practitioners and Physician Assistants) work with your physician to ensure care continuity for you. They are fully qualified to address your health concerns and develop a treatment plan. They communicate directly with your gastroenterologist to care for you. Seeing the Advanced Practice Practitioners on your physician's team can help you by facilitating care more promptly, often allowing for earlier appointments, access to diagnostic testing, procedures, and other specialty referrals.   We have sent the following medications to your pharmacy for you to pick up at your convenience: Suprep  Two days  before your procedure: Mix 3 packs (or capfuls) of Miralax  in 48 ounces of clear liquid and drink at 6pm.  You have been scheduled for a colonoscopy. Please follow written instructions given to you at your visit today.   If you use inhalers (even only as needed), please bring them with you on the day of your procedure.  DO NOT TAKE 7 DAYS PRIOR TO TEST- Trulicity (dulaglutide) Ozempic, Wegovy (semaglutide) Mounjaro, Zepbound (tirzepatide) Bydureon Bcise (exanatide extended release)  DO NOT TAKE 1 DAY PRIOR TO YOUR TEST Rybelsus (semaglutide) Adlyxin (lixisenatide) Victoza (liraglutide) Byetta (exanatide) ___________________________________________________________________________  Thank you,  Dr. Lynnie Bring

## 2024-11-03 NOTE — Progress Notes (Signed)
 Chief Complaint: H/O polyps  Referring Provider:  Alvia Corean CROME, *      ASSESSMENT AND PLAN;   #1. H/O advanced polyps on colonoscopy 2023 per patient.  #2. Rectal bleeding (resolved). Hb 16 (08/2024).   Plan: -Rpt colon with 2 day prep (Jan 2026) -Records from prev colon ([facility]) -If any further rectal bleeding, use Preparation H 1 twice daily after the bowel movement for 7 to 10 days.   Discussed risks & benefits of colonoscopy. Risks including rare perforation req laparotomy, bleeding after bx/polypectomy req blood transfusion, rarely missing neoplasms, risks of anesthesia/sedation, rare risk of damage to internal organs. Benefits outweigh the risks. Patient agrees to proceed. All the questions were answered. Pt consents to proceed. HPI:    History of Present Illness Derrick Harrell is a 48 year old male with a history of HTN (using herbal medicines), gout, anxiety/depression, HLD, colon polyps who presents with rectal bleeding. He was referred by his primary care doctor for evaluation of rectal bleeding and overdue colonoscopy.  Approximately one month ago, he experienced blood in his stool. He initially consulted his primary care physician, who referred him to another primary doctor within the same health system, leading to the current gastroenterology consultation.  He has a history of colonoscopy performed two years ago, during which three or four polyps were removed, and internal hemorrhoids were identified. He was advised to have a follow-up colonoscopy within six to eight months, but he did not reschedule the procedure and is now overdue.  He denies any current rectal bleeding and notes that his blood tests were normal. He experiences frequent diarrhea, sometimes alternating with constipation, with diarrhea being more predominant. He identifies hot sauce and spaghetti sauce as dietary triggers that exacerbate his symptoms.  He does not take any prescribed medications  for blood pressure, opting instead for herbal remedies such as garlic. He reports a history of elevated blood pressure readings and notes that he smoked a cigarette prior to his appointment.  There is no known family history of colon cancer or colon polyps. He smokes cigarettes and uses herbal remedies for blood pressure management. No use of blood thinners or aspirin.  No nausea, vomiting, heartburn, regurgitation, odynophagia or dysphagia.  No significant diarrhea or constipation.  No melena or hematochezia. No unintentional weight loss. No abdominal pain.   Past GI workup: 57yr ago - [facility]- 3-4 polyps, int hoids. Rpt in 6 months.  Was scheduled but did not show up, was discharged Past Medical History:  Diagnosis Date   Gout    Hypertension     No past surgical history on file.  Family History  Problem Relation Age of Onset   Uterine cancer Mother    Hypertension Mother    Hypertension Father    Liver disease Father     Social History   Tobacco Use   Smoking status: Every Day    Types: Cigarettes   Smokeless tobacco: Never  Substance Use Topics   Alcohol use: Yes   Drug use: Not Currently    Current Outpatient Medications  Medication Sig Dispense Refill   nicotine  (NICODERM CQ  - DOSED IN MG/24 HOURS) 21 mg/24hr patch Place 1 patch (21 mg total) onto the skin daily. (Patient taking differently: Place 21 mg onto the skin daily. Patient has not picked up from pharmacy) 28 patch 1   atorvastatin (LIPITOR) 20 MG tablet Take 20 mg by mouth at bedtime. (Patient not taking: Reported on 09/08/2024)  chlorthalidone (HYGROTON) 25 MG tablet Take 25 mg by mouth daily. (Patient not taking: Reported on 09/08/2024)     fluticasone (FLONASE) 50 MCG/ACT nasal spray SHAKE LIQUID AND USE 1 SPRAY IN EACH NOSTRIL ONCE DAILY (Patient not taking: Reported on 09/08/2024)     losartan (COZAAR) 25 MG tablet Take 25 mg by mouth daily. (Patient not taking: Reported on 09/08/2024)      nicotine  polacrilex (NICORETTE ) 4 MG gum Take 1 each (4 mg total) by mouth as needed for smoking cessation. (Patient not taking: Reported on 09/08/2024) 100 tablet 0   polyethylene glycol powder (GLYCOLAX /MIRALAX ) 17 GM/SCOOP powder Take 17 g by mouth daily. (Patient not taking: Reported on 09/08/2024) 255 g 0   Vitamin D , Ergocalciferol , (DRISDOL ) 1.25 MG (50000 UNIT) CAPS capsule Take 1 capsule (50,000 Units total) by mouth every 7 (seven) days. (Patient not taking: Reported on 09/08/2024) 8 capsule 0   No current facility-administered medications for this visit.    Allergies  Allergen Reactions   Amlodipine     Not effective    Review of Systems:  Constitutional: Denies fever, chills, diaphoresis, appetite change and has fatigue.  HEENT: neg Respiratory: Denies SOB, DOE, cough, chest tightness,  and wheezing.   Cardiovascular: Denies chest pain, palpitations and leg swelling.  Genitourinary: Denies dysuria, urgency, frequency, hematuria, flank pain and difficulty urinating.  Musculoskeletal: Denies myalgias, has back pain, no joint swelling, arthralgias and gait problem.  Skin: No rash.  Neurological: Denies dizziness, seizures, syncope, weakness, light-headedness, numbness and headaches.  Hematological: Denies adenopathy. Easy bruising, personal or family bleeding history  Psychiatric/Behavioral: Has anxiety or depression     Physical Exam:    BP (!) 164/98   Pulse 92   Ht 5' 8 (1.727 m)   Wt 259 lb 9.6 oz (117.8 kg)   BMI 39.47 kg/m  Wt Readings from Last 3 Encounters:  11/03/24 259 lb 9.6 oz (117.8 kg)  09/08/24 254 lb 8 oz (115.4 kg)  04/21/24 260 lb 12.8 oz (118.3 kg)   Constitutional:  Well-developed, in no acute distress. Psychiatric: Normal mood and affect. Behavior is normal. HEENT: Pupils normal.  Conjunctivae are normal. No scleral icterus. Cardiovascular: Normal rate, regular rhythm. No edema Pulmonary/chest: Effort normal and breath sounds normal. No  wheezing, rales or rhonchi. Abdominal: Soft, nondistended. Nontender. Bowel sounds active throughout. There are no masses palpable. No hepatomegaly.  Small umbilical hernia Rectal: Deferred.  To be performed at the time of colonoscopy Neurological: Alert and oriented to person place and time. Skin: Skin is warm and dry. No rashes noted.  Data Reviewed: I have personally reviewed following labs and imaging studies  CBC:    Latest Ref Rng & Units 09/10/2024    4:39 PM 02/12/2024   10:56 AM 08/21/2023    5:32 PM  CBC  WBC 4.0 - 10.5 K/uL 8.0  6.7  7.1   Hemoglobin 13.0 - 17.0 g/dL 84.0  84.5  83.9   Hematocrit 39.0 - 52.0 % 49.4  47.8  50.5   Platelets 150.0 - 400.0 K/uL 236.0  284.0  245     CMP:    Latest Ref Rng & Units 02/12/2024   10:56 AM 08/21/2023    5:32 PM  CMP  Glucose 70 - 99 mg/dL 96  897   BUN 6 - 23 mg/dL 11  5   Creatinine 9.59 - 1.50 mg/dL 9.18  9.06   Sodium 864 - 145 mEq/L 138  135   Potassium 3.5 - 5.1  mEq/L 4.1  4.0   Chloride 96 - 112 mEq/L 105  103   CO2 19 - 32 mEq/L 26  21   Calcium 8.4 - 10.5 mg/dL 9.7  8.9   Total Protein 6.0 - 8.3 g/dL 7.5  7.3   Total Bilirubin 0.2 - 1.2 mg/dL 0.4  0.6   Alkaline Phos 39 - 117 U/L 59  63   AST 0 - 37 U/L 16  20   ALT 0 - 53 U/L 20  23        Anselm Bring, MD 11/03/2024, 4:04 PM  Cc: Alvia Corean CROME, *

## 2024-11-12 ENCOUNTER — Ambulatory Visit (INDEPENDENT_AMBULATORY_CARE_PROVIDER_SITE_OTHER): Admitting: Physician Assistant

## 2024-11-12 ENCOUNTER — Encounter (HOSPITAL_COMMUNITY): Payer: Self-pay | Admitting: Physician Assistant

## 2024-11-12 VITALS — BP 145/99 | HR 81 | Temp 98.1°F | Ht 68.0 in | Wt 254.2 lb

## 2024-11-12 DIAGNOSIS — F331 Major depressive disorder, recurrent, moderate: Secondary | ICD-10-CM | POA: Diagnosis not present

## 2024-11-12 DIAGNOSIS — F1020 Alcohol dependence, uncomplicated: Secondary | ICD-10-CM | POA: Diagnosis not present

## 2024-11-12 DIAGNOSIS — Z720 Tobacco use: Secondary | ICD-10-CM

## 2024-11-12 DIAGNOSIS — F411 Generalized anxiety disorder: Secondary | ICD-10-CM

## 2024-11-12 MED ORDER — NICOTINE 21 MG/24HR TD PT24: 21.0000 mg | MEDICATED_PATCH | Freq: Every day | TRANSDERMAL | 1 refills | Status: AC

## 2024-11-12 NOTE — Progress Notes (Signed)
 BH MD/PA/NP OP Progress Note  11/12/2024 7:53 PM Shmuel Girgis  MRN:  [id number]  Chief Complaint:  Chief Complaint  Patient presents with   Follow-up   Medication Refill   HPI:   Derrick Harrell is a 48 year old male with a past psychiatric history significant for alcohol use disorder (severe dependence), tobacco use, generalized anxiety disorder, and major depressive disorder (moderate episode, recurrent) who presents to [facility] for follow-up and medication management.  Patient is currently being managed on the following medication: NicoDerm CQ  21 mg/24-hour patch daily.  Since the last encounter, patient reports that his mood has been all right.  He reports that he is still dealing with issues with his son.  He reports that his son is currently sitting in prison for a crime that he possibly didn't commit.  He reports that he has excommunicated with some of his family members due to them being toxic and spreading rumors.  Patient endorses depression related to his son being in prison.  He reports that when he talks with his son, he can tell when he is in a good mood or when he is depressed.  He reports that he has not seen his son since February or March of this year.  He reports that he has been communicating with his son through the phone since July.  Patient also endorses anxiety attributed to starting a new job.  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  He also endorses stressors attributed to his son and having to pay bills.  A PHQ-9 screen was performed with the patient scoring a 7.  A GAD-7 screen was also performed with the patient scoring of 13.  Patient is alert and oriented x 4, calm, cooperative, and fully engaged in conversation during the encounter.  Patient describes his mood as being preoccupied with all the  things that he has to do.  Patient exhibits depressed mood with appropriate affect.  Patient denies suicidal or homicidal ideations.  He further denies auditory or visual hallucinations and does not appear to be responding to internal/external stimuli.  Patient endorses good sleep and receives on average 7 hours of sleep per night.  Patient endorses good appetite and eats on average 2 meals per day.  Patient endorses alcohol consumption and states that he has decreased his consumption since starting work.  Patient endorses tobacco use and smokes on average 5 cigarettes to 1/2 pack/day.  Patient denies illicit drug use.  Visit Diagnosis:    ICD-10-CM   1. Alcohol use disorder, severe, dependence (HCC)  F10.20     2. Tobacco use  Z72.0 nicotine  (NICODERM CQ  - DOSED IN MG/24 HOURS) 21 mg/24hr patch    3. GAD (generalized anxiety disorder)  F41.1     4. Moderate episode of recurrent major depressive disorder (HCC)  F33.1       Past Psychiatric History:  Patient has a past psychiatric history significant for alcohol dependence, major depressive disorder (severe, without psychotic features) and generalized anxiety disorder.   Patient denies a past history of hospitalization due to mental health.   Patient denies a past history of suicide attempt.   Patient denies a past history of homicide attempt.  Past Medical History:  Past Medical History:  Diagnosis Date   Gout    Hypertension    History reviewed. No pertinent surgical history.  Family Psychiatric History:  Patient is unsure of family history of psychiatric illness.   Family history of suicide attempt: Patient denies Family history of homicide attempt: Patient denies Family history of substance abuse: Patient reports that both sides of his family abused drugs and alcohol.  Family History:  Family History  Problem Relation Age of Onset   Uterine cancer Mother    Hypertension Mother    Hypertension Father    Liver disease  Father     Social History:  Social History   Socioeconomic History   Marital status: Married    Spouse name: Not on file   Number of children: Not on file   Years of education: Not on file   Highest education level: Not on file  Occupational History   Not on file  Tobacco Use   Smoking status: Every Day    Types: Cigarettes   Smokeless tobacco: Never  Substance and Sexual Activity   Alcohol use: Yes   Drug use: Not Currently   Sexual activity: Not on file  Other Topics Concern   Not on file  Social History Narrative   Not on file   Social Drivers of Health   Tobacco Use: High Risk (11/12/2024)   Patient History    Smoking Tobacco Use: Every Day    Smokeless Tobacco Use: Never    Passive Exposure: Not on file  Financial Resource Strain: High Risk (08/12/2024)   Overall Financial Resource Strain (CARDIA)    Difficulty of Paying Living Expenses: Hard  Food Insecurity: Not on file  Transportation Needs: Not on file  Physical Activity: Not on file  Stress: Not on file  Social Connections: Not on file  Depression (PHQ2-9): Medium Risk (11/12/2024)   Depression (PHQ2-9)    PHQ-2 Score: 7  Alcohol Screen: High Risk (08/12/2024)   Alcohol Screen    Last Alcohol Screening Score (AUDIT): 29  Housing: Not on file  Utilities: Not on file  Health Literacy: Not on file    Allergies: Allergies[1]  Metabolic Disorder Labs: No results found for: HGBA1C, MPG No results found for: PROLACTIN Lab Results  Component Value Date   CHOL 242 (H) 02/12/2024   TRIG 104.0 02/12/2024   HDL 54.10 02/12/2024   CHOLHDL 4 02/12/2024   VLDL 20.8 02/12/2024   LDLCALC 168 (H) 02/12/2024   No results found for: TSH  Therapeutic Level Labs: No results found for: LITHIUM No results found for: VALPROATE No results found for: CBMZ  Current Medications: Current Outpatient Medications  Medication Sig Dispense Refill   atorvastatin (LIPITOR) 20 MG tablet Take 20 mg by mouth  at bedtime. (Patient not taking: Reported on 09/08/2024)     chlorthalidone (HYGROTON) 25 MG tablet Take 25 mg by mouth daily. (Patient not taking: Reported on 09/08/2024)     fluticasone (FLONASE) 50 MCG/ACT nasal spray SHAKE LIQUID AND USE 1 SPRAY IN EACH NOSTRIL ONCE DAILY (Patient not taking: Reported on 09/08/2024)     losartan (COZAAR) 25 MG tablet Take 25 mg by mouth daily. (Patient not taking: Reported on 09/08/2024)     nicotine  (NICODERM CQ  - DOSED IN MG/24 HOURS) 21 mg/24hr patch Place 1 patch (21 mg total) onto the skin daily. 28 patch 1   nicotine  polacrilex (NICORETTE ) 4 MG gum Take 1 each (4 mg total) by mouth as needed for smoking cessation. (Patient not taking: Reported on 09/08/2024) 100 tablet 0   polyethylene glycol powder (GLYCOLAX /MIRALAX ) 17 GM/SCOOP powder Take 17 g by mouth daily. (Patient not taking: Reported on  09/08/2024) 255 g 0   Vitamin D , Ergocalciferol , (DRISDOL ) 1.25 MG (50000 UNIT) CAPS capsule Take 1 capsule (50,000 Units total) by mouth every 7 (seven) days. (Patient not taking: Reported on 09/08/2024) 8 capsule 0   No current facility-administered medications for this visit.     Musculoskeletal: Strength & Muscle Tone: within normal limits Gait & Station: normal Patient leans: N/A  Psychiatric Specialty Exam: Review of Systems  Psychiatric/Behavioral:  Positive for dysphoric mood. Negative for decreased concentration, hallucinations, self-injury, sleep disturbance and suicidal ideas. The patient is nervous/anxious. The patient is not hyperactive.     Blood pressure (!) 145/99, pulse 81, temperature 98.1 F (36.7 C), temperature source Oral, height 5' 8 (1.727 m), weight 254 lb 3.2 oz (115.3 kg), SpO2 96%.Body mass index is 38.65 kg/m.  General Appearance: Casual  Eye Contact:  Good  Speech:  Clear and Coherent and Normal Rate  Volume:  Normal  Mood:  Anxious and Depressed  Affect:  Congruent  Thought Process:  Coherent, Goal Directed, and  Descriptions of Associations: Intact  Orientation:  Full (Time, Place, and Person)  Thought Content: WDL   Suicidal Thoughts:  No  Homicidal Thoughts:  No  Memory:  Immediate;   Good Recent;   Good Remote;   Good  Judgement:  Good  Insight:  Good  Psychomotor Activity:  Normal  Concentration:  Concentration: Good and Attention Span: Good  Recall:  Good  Fund of Knowledge: Good  Language: Good  Akathisia:  No  Handed:  Ambidextrous  AIMS (if indicated): not done  Assets:  Communication Skills Desire for Improvement Housing Social Support Transportation  ADL's:  Intact  Cognition: WNL  Sleep:  Good   Screenings: AUDIT    Advertising Copywriter from 08/12/2024 in [facility]  Alcohol Use Disorder Identification Test Final Score (AUDIT) 29   GAD-7    Flowsheet Row Clinical Support from 11/12/2024 in [facility] Office Visit from 10/01/2024 in [facility] Counselor from 08/12/2024 in [facility]  Total GAD-7 Score 13 13 10    PHQ2-9    Flowsheet Row Clinical Support from 11/12/2024 in [facility] Office Visit from 10/01/2024 in [facility] Counselor from 08/12/2024 in [facility] Office Visit from 02/12/2024 in [facility] [place]  PHQ-2 Total Score 2 5 6  0  PHQ-9 Total Score 7 12 19  --   Flowsheet Row Clinical Support from 11/12/2024 in [facility] Office Visit from 10/01/2024 in [facility] Counselor from 08/12/2024 in [facility]  C-SSRS RISK CATEGORY No Risk Low Risk No Risk     Assessment and Plan:   General Wearing is a 48 year old male with a past psychiatric history significant for alcohol use disorder (severe dependence), tobacco use, generalized anxiety  disorder, and major depressive disorder (moderate episode, recurrent) who presents to [facility] for follow-up and medication management.  Patient is currently being managed on the following medication: NicoDerm CQ  21 mg/24-hour patch daily.  Since the last encounter, patient reports that he has not been able to pick up his NicoDerm patches.  Patient is still interested in using the patches for tobacco cessation.  Provider to refill patient's prescription for NicoDerm CQ  21 mg/24-hour patch daily.  Patient continues to endorse depression and anxiety attributed to stressors in his life.  Patient  states that he continues to be impacted by his son currently being in prison and has only been able to communicate with him through the phone.  Patient also reports that he has been experiencing some anxiety from working at his new job.  A PHQ-9 screen was performed with the patient scoring a 7.  A GAD-7 screen was also performed with the patient scoring a 13.  Patient reports that he still continues to consume alcohol; however, the amount he drinks has lessened since starting his job.  Patient is still interested in receiving the Vivitrol injection for the management of his alcohol use disorder.  Patient to be set up with an appointment for [facility] to receive his first injection of Vivitrol 380 mg once a month.  Patient vocalized understanding. Patient would like to hold off on medication management for for his depression and anxiety until he receives his Vivitrol injection.  A [place] Suicide Severity Rating Scale was performed with the patient being considered no risk.  Patient denies suicidal ideations and is able to contract for safety at this time.    Collaboration of Care: Collaboration of Care: Medication Management AEB provider managing patient's psychiatric medications, Psychiatrist AEB patient being followed by a mental health  provider at this facility, and Other provider involved in patient's care AEB patient being seen by gastroenterology  Patient/Guardian was advised Release of Information must be obtained prior to any record release in order to collaborate their care with an outside provider. Patient/Guardian was advised if they have not already done so to contact the registration department to sign all necessary forms in order for us  to release information regarding their care.   Consent: Patient/Guardian gives verbal consent for treatment and assignment of benefits for services provided during this visit. Patient/Guardian expressed understanding and agreed to proceed.   1. Tobacco use  - nicotine  (NICODERM CQ  - DOSED IN MG/24 HOURS) 21 mg/24hr patch; Place 1 patch (21 mg total) onto the skin daily.  Dispense: 28 patch; Refill: 1  2. Alcohol use disorder, severe, dependence (HCC) (Primary) Patient has an appointment set up with shot clinic to receive Vivitrol 380 mg every 30 days for the management of his alcohol use disorder  3. GAD (generalized anxiety disorder) Patient declines medication management for his anxiety at this time  4. Moderate episode of recurrent major depressive disorder [facility]) Patient declines medication management for his depression at this time  Patient to follow-up in 6 weeks Patient has an appointment with [facility] on [address] Provider spent a total of 25 minutes with the patient/reviewing patient's chart  Reginia FORBES Bolster, PA 11/12/2024, 7:53 PM     [1]  Allergies Allergen Reactions   Amlodipine     Not effective

## 2024-12-08 ENCOUNTER — Other Ambulatory Visit (HOSPITAL_COMMUNITY): Payer: Self-pay | Admitting: Physician Assistant

## 2024-12-08 DIAGNOSIS — F1020 Alcohol dependence, uncomplicated: Secondary | ICD-10-CM

## 2024-12-08 MED ORDER — NALTREXONE 380 MG IM SUSR: 380.0000 mg | Freq: Once | INTRAMUSCULAR | Status: AC

## 2024-12-08 NOTE — Progress Notes (Signed)
 Patient to be administered Vivitrol  380 mg injection for the management of his alcohol use disorder.

## 2024-12-09 ENCOUNTER — Ambulatory Visit (HOSPITAL_COMMUNITY)

## 2024-12-17 ENCOUNTER — Ambulatory Visit (HOSPITAL_COMMUNITY)

## 2024-12-18 ENCOUNTER — Telehealth: Payer: Self-pay | Admitting: *Deleted

## 2024-12-18 DIAGNOSIS — Z860100 Personal history of colon polyps, unspecified: Secondary | ICD-10-CM

## 2024-12-18 NOTE — Telephone Encounter (Signed)
 Spoke with regarding his appt on 12-21-24 and that we are cancelling his colonoscopy d/t weather. He states that he needs to have colonoscopy done prior to February 1st since he is starting a new job.  Dr. Charlanne doesn't have any opening prior to 12-25-24 but Dr. Suzann does on 12-25-24 at 730.  Spoke with Dr. Charlanne and Dr. Suzann and both are ok with this switch.    New instructions sent to pt via MyChart

## 2024-12-21 ENCOUNTER — Encounter: Admitting: Gastroenterology

## 2024-12-23 NOTE — Progress Notes (Unsigned)
  Gastroenterology History and Physical   Primary Care Physician:  Alvia Corean CROME, FNP   Reason for Procedure:  Surveillance of colon polyps, rectal bleeding, alternating constipation and diarrhea  Plan:    Colonoscopy   The patient was provided an opportunity to ask questions and all were answered. The patient agreed with the plan.   HPI: Derrick Harrell is a 49 y.o. male undergoing colonoscopy for surveillance of colon polyps as well as symptoms of rectal bleeding and alternating constipation and diarrhea.  Patient reports having a colonoscopy in 2002 or 2023 that disclosed 3 of 4 polyps.  He was advised to have a repeat colonoscopy in 6 to 8 months but this was not scheduled.  Presented to clinic overdue for surveillance colonoscopy but also reporting alternating constipation and diarrhea as well as rectal bleeding.  Hemoglobin stable.  No family history of colorectal cancer or polyps.   Past Medical History:  Diagnosis Date   Gout    Hypertension     No past surgical history on file.  Prior to Admission medications  Medication Sig Start Date End Date Taking? Authorizing Provider  atorvastatin (LIPITOR) 20 MG tablet Take 20 mg by mouth at bedtime. Patient not taking: Reported on 09/08/2024 01/10/24   [provider]  chlorthalidone (HYGROTON) 25 MG tablet Take 25 mg by mouth daily. Patient not taking: Reported on 09/08/2024    [provider]  fluticasone (FLONASE) 50 MCG/ACT nasal spray SHAKE LIQUID AND USE 1 SPRAY IN EACH NOSTRIL ONCE DAILY Patient not taking: Reported on 09/08/2024    [provider]  losartan (COZAAR) 25 MG tablet Take 25 mg by mouth daily. Patient not taking: Reported on 09/08/2024 10/18/23   [provider]  nicotine  (NICODERM CQ  - DOSED IN MG/24 HOURS) 21 mg/24hr patch Place 1 patch (21 mg total) onto the skin daily. 11/12/24   Nwoko, Uchenna E, PA  nicotine  polacrilex (NICORETTE ) 4 MG gum Take 1 each (4 mg  total) by mouth as needed for smoking cessation. Patient not taking: Reported on 09/08/2024 02/12/24   Alvia Corean CROME, FNP  polyethylene glycol powder (GLYCOLAX /MIRALAX ) 17 GM/SCOOP powder Take 17 g by mouth daily. Patient not taking: Reported on 09/08/2024 08/21/23   Desiderio Chew, PA-C  Vitamin D , Ergocalciferol , (DRISDOL ) 1.25 MG (50000 UNIT) CAPS capsule Take 1 capsule (50,000 Units total) by mouth every 7 (seven) days. Patient not taking: Reported on 09/08/2024 03/18/24   Alvia Corean CROME, FNP    Current Outpatient Medications  Medication Sig Dispense Refill   atorvastatin (LIPITOR) 20 MG tablet Take 20 mg by mouth at bedtime. (Patient not taking: Reported on 09/08/2024)     chlorthalidone (HYGROTON) 25 MG tablet Take 25 mg by mouth daily. (Patient not taking: Reported on 09/08/2024)     fluticasone (FLONASE) 50 MCG/ACT nasal spray SHAKE LIQUID AND USE 1 SPRAY IN EACH NOSTRIL ONCE DAILY (Patient not taking: Reported on 09/08/2024)     losartan (COZAAR) 25 MG tablet Take 25 mg by mouth daily. (Patient not taking: Reported on 09/08/2024)     nicotine  (NICODERM CQ  - DOSED IN MG/24 HOURS) 21 mg/24hr patch Place 1 patch (21 mg total) onto the skin daily. 28 patch 1   nicotine  polacrilex (NICORETTE ) 4 MG gum Take 1 each (4 mg total) by mouth as needed for smoking cessation. (Patient not taking: Reported on 09/08/2024) 100 tablet 0   polyethylene glycol powder (GLYCOLAX /MIRALAX ) 17 GM/SCOOP powder Take 17 g by mouth daily. (Patient not taking: Reported on  09/08/2024) 255 g 0   Vitamin D , Ergocalciferol , (DRISDOL ) 1.25 MG (50000 UNIT) CAPS capsule Take 1 capsule (50,000 Units total) by mouth every 7 (seven) days. (Patient not taking: Reported on 09/08/2024) 8 capsule 0   Current Facility-Administered Medications  Medication Dose Route Frequency Provider Last Rate Last Admin   naltrexone  IM injection 380 mg  380 mg Intramuscular Once         Allergies as of 12/25/2024 - Review Complete  11/12/2024  Allergen Reaction Noted   Amlodipine  02/12/2024    Family History  Problem Relation Age of Onset   Uterine cancer Mother    Hypertension Mother    Hypertension Father    Liver disease Father     Social History   Socioeconomic History   Marital status: Married    Spouse name: Not on file   Number of children: Not on file   Years of education: Not on file   Highest education level: Not on file  Occupational History   Not on file  Tobacco Use   Smoking status: Every Day    Types: Cigarettes   Smokeless tobacco: Never  Substance and Sexual Activity   Alcohol use: Yes   Drug use: Not Currently   Sexual activity: Not on file  Other Topics Concern   Not on file  Social History Narrative   Not on file   Social Drivers of Health   Tobacco Use: High Risk (11/12/2024)   Patient History    Smoking Tobacco Use: Every Day    Smokeless Tobacco Use: Never    Passive Exposure: Not on file  Financial Resource Strain: High Risk (08/12/2024)   Overall Financial Resource Strain (CARDIA)    Difficulty of Paying Living Expenses: Hard  Food Insecurity: Not on file  Transportation Needs: Not on file  Physical Activity: Not on file  Stress: Not on file  Social Connections: Not on file  Intimate Partner Violence: Not on file  Depression (PHQ2-9): Medium Risk (11/12/2024)   Depression (PHQ2-9)    PHQ-2 Score: 7  Alcohol Screen: High Risk (08/12/2024)   Alcohol Screen    Last Alcohol Screening Score (AUDIT): 29  Housing: Not on file  Utilities: Not on file  Health Literacy: Not on file    Review of Systems:  All other review of systems negative except as mentioned in the HPI.  Physical Exam: Vital signs There were no vitals taken for this visit.  General:   Alert,  Well-developed, well-nourished, pleasant and cooperative in NAD Airway:  Mallampati  Lungs:  Clear throughout to auscultation.   Heart:  Regular rate and rhythm; no murmurs, clicks, rubs,  or  gallops. Abdomen:  Soft, nontender and nondistended. Normal bowel sounds.   Neuro/Psych:  Normal mood and affect. A and O x 3  Inocente Hausen, MD [facility]

## 2024-12-24 ENCOUNTER — Telehealth: Payer: Self-pay | Admitting: Pediatrics

## 2024-12-24 ENCOUNTER — Telehealth (HOSPITAL_COMMUNITY): Payer: Self-pay

## 2024-12-24 NOTE — Telephone Encounter (Signed)
 Patient had called and ended up on hold at the front desk. He mentioned that he was waiting for someone to come back to him to discuss his injection appointment. Upon looking at his appointment history, I saw he no showed his last two injection appointments. We discussed his appointment with Eddie next week. He has to work that day, so we rescheduled for the next day. Patient mentioned that he had left a message for Darice on her voice mail, but never heard back. Patient was becoming increasingly agitated. I agreed to leave a message for Darice and try to find out who spoke with him before he ended up on hold at the front desk.Please advise. Thank you.

## 2024-12-24 NOTE — Telephone Encounter (Signed)
 Good morning Dr. Suzann,   Patient called wishing to reschedule tomorrow's colonoscopy due to failing to complete diet due to misplacing prep instructions. Patient rescheduled for 2/9.  Thank you

## 2024-12-25 ENCOUNTER — Encounter: Admitting: Pediatrics

## 2024-12-29 ENCOUNTER — Encounter (HOSPITAL_COMMUNITY): Admitting: Physician Assistant

## 2024-12-30 ENCOUNTER — Ambulatory Visit (HOSPITAL_COMMUNITY)

## 2024-12-30 ENCOUNTER — Encounter (HOSPITAL_COMMUNITY): Admitting: Physician Assistant

## 2025-01-04 ENCOUNTER — Encounter: Admitting: Pediatrics

## 2025-02-02 ENCOUNTER — Ambulatory Visit (HOSPITAL_COMMUNITY)
